# Patient Record
Sex: Male | Born: 1945
Health system: Southern US, Community
[De-identification: ages and names within clinical notes are randomized; demographics above are authoritative.]

## PROBLEM LIST (undated history)

## (undated) DIAGNOSIS — C801 Malignant (primary) neoplasm, unspecified: Secondary | ICD-10-CM

## (undated) DIAGNOSIS — N400 Enlarged prostate without lower urinary tract symptoms: Secondary | ICD-10-CM

## (undated) DIAGNOSIS — L989 Disorder of the skin and subcutaneous tissue, unspecified: Secondary | ICD-10-CM

## (undated) DIAGNOSIS — N200 Calculus of kidney: Secondary | ICD-10-CM

## (undated) DIAGNOSIS — E039 Hypothyroidism, unspecified: Secondary | ICD-10-CM

## (undated) DIAGNOSIS — Z85828 Personal history of other malignant neoplasm of skin: Secondary | ICD-10-CM

## (undated) DIAGNOSIS — Z9289 Personal history of other medical treatment: Secondary | ICD-10-CM

## (undated) DIAGNOSIS — I5189 Other ill-defined heart diseases: Secondary | ICD-10-CM

## (undated) DIAGNOSIS — N1330 Unspecified hydronephrosis: Secondary | ICD-10-CM

## (undated) HISTORY — PX: URETHRAL STRICTURE DILATATION: SHX477

## (undated) HISTORY — PX: CYSTOSCOPY PROSTATE W/ LASER: SUR372

---

## 2003-11-06 ENCOUNTER — Ambulatory Visit (HOSPITAL_COMMUNITY): Admission: RE | Admit: 2003-11-06 | Discharge: 2003-11-06 | Payer: Self-pay | Admitting: Urology

## 2004-01-27 ENCOUNTER — Ambulatory Visit (HOSPITAL_COMMUNITY): Admission: RE | Admit: 2004-01-27 | Discharge: 2004-01-27 | Payer: Self-pay | Admitting: Urology

## 2007-06-14 ENCOUNTER — Ambulatory Visit (HOSPITAL_COMMUNITY): Admission: RE | Admit: 2007-06-14 | Discharge: 2007-06-14 | Payer: Self-pay | Admitting: Urology

## 2009-03-17 ENCOUNTER — Inpatient Hospital Stay (HOSPITAL_COMMUNITY): Admission: EM | Admit: 2009-03-17 | Discharge: 2009-03-25 | Payer: Self-pay | Admitting: Emergency Medicine

## 2009-03-17 ENCOUNTER — Ambulatory Visit: Payer: Self-pay | Admitting: Cardiology

## 2009-03-22 ENCOUNTER — Encounter (INDEPENDENT_AMBULATORY_CARE_PROVIDER_SITE_OTHER): Payer: Self-pay | Admitting: Internal Medicine

## 2009-04-07 ENCOUNTER — Ambulatory Visit (HOSPITAL_COMMUNITY): Admission: RE | Admit: 2009-04-07 | Discharge: 2009-04-07 | Payer: Self-pay | Admitting: Urology

## 2009-05-11 ENCOUNTER — Observation Stay (HOSPITAL_COMMUNITY): Admission: RE | Admit: 2009-05-11 | Discharge: 2009-05-13 | Payer: Self-pay | Admitting: Urology

## 2009-06-01 ENCOUNTER — Ambulatory Visit (HOSPITAL_COMMUNITY): Admission: RE | Admit: 2009-06-01 | Discharge: 2009-06-01 | Payer: Self-pay | Admitting: Urology

## 2009-11-29 ENCOUNTER — Emergency Department (HOSPITAL_COMMUNITY): Admission: EM | Admit: 2009-11-29 | Discharge: 2009-11-29 | Payer: Self-pay | Admitting: Emergency Medicine

## 2009-12-01 ENCOUNTER — Ambulatory Visit (HOSPITAL_COMMUNITY): Admission: RE | Admit: 2009-12-01 | Discharge: 2009-12-01 | Payer: Self-pay

## 2010-03-20 HISTORY — PX: TRANSURETHRAL RESECTION OF PROSTATE: SHX73

## 2010-06-02 LAB — DIFFERENTIAL
Basophils Absolute: 0 10*3/uL (ref 0.0–0.1)
Basophils Relative: 0 % (ref 0–1)
Eosinophils Absolute: 0 10*3/uL (ref 0.0–0.7)
Eosinophils Relative: 0 % (ref 0–5)
Lymphocytes Relative: 4 % — ABNORMAL LOW (ref 12–46)
Lymphs Abs: 0.5 10*3/uL — ABNORMAL LOW (ref 0.7–4.0)
Monocytes Absolute: 0.7 10*3/uL (ref 0.1–1.0)
Monocytes Relative: 6 % (ref 3–12)
Neutro Abs: 11.4 10*3/uL — ABNORMAL HIGH (ref 1.7–7.7)
Neutrophils Relative %: 90 % — ABNORMAL HIGH (ref 43–77)

## 2010-06-02 LAB — BASIC METABOLIC PANEL
BUN: 25 mg/dL — ABNORMAL HIGH (ref 6–23)
CO2: 26 mEq/L (ref 19–32)
Calcium: 9.4 mg/dL (ref 8.4–10.5)
Chloride: 100 mEq/L (ref 96–112)
Creatinine, Ser: 1.86 mg/dL — ABNORMAL HIGH (ref 0.4–1.5)
GFR calc Af Amer: 44 mL/min — ABNORMAL LOW (ref >60)
GFR calc non Af Amer: 37 mL/min — ABNORMAL LOW (ref >60)
Glucose, Bld: 119 mg/dL — ABNORMAL HIGH (ref 70–99)
Potassium: 4 mEq/L (ref 3.5–5.1)
Sodium: 135 mEq/L (ref 135–145)

## 2010-06-02 LAB — URINALYSIS, ROUTINE W REFLEX MICROSCOPIC
Bilirubin Urine: NEGATIVE
Glucose, UA: NEGATIVE mg/dL
Ketones, ur: NEGATIVE mg/dL
Nitrite: POSITIVE — AB
Protein, ur: 30 mg/dL — AB
Specific Gravity, Urine: 1.02 (ref 1.005–1.030)
Urobilinogen, UA: 0.2 mg/dL (ref 0.0–1.0)
pH: 6 (ref 5.0–8.0)

## 2010-06-02 LAB — URINE MICROSCOPIC-ADD ON

## 2010-06-02 LAB — URINE CULTURE
Colony Count: >=100000
Culture  Setup Time: 201109122105

## 2010-06-02 LAB — CBC
HCT: 44.5 % (ref 39.0–52.0)
Hemoglobin: 15 g/dL (ref 13.0–17.0)
MCH: 31 pg (ref 26.0–34.0)
MCHC: 33.7 g/dL (ref 30.0–36.0)
MCV: 92.1 fL (ref 78.0–100.0)
Platelets: 193 10*3/uL (ref 150–400)
RBC: 4.83 MIL/uL (ref 4.22–5.81)
RDW: 14.2 % (ref 11.5–15.5)
WBC: 12.7 10*3/uL — ABNORMAL HIGH (ref 4.0–10.5)

## 2010-06-05 LAB — BASIC METABOLIC PANEL
BUN: 33 mg/dL — ABNORMAL HIGH (ref 6–23)
BUN: 44 mg/dL — ABNORMAL HIGH (ref 6–23)
CO2: 23 mEq/L (ref 19–32)
CO2: 26 mEq/L (ref 19–32)
Calcium: 7.3 mg/dL — ABNORMAL LOW (ref 8.4–10.5)
Calcium: 7.5 mg/dL — ABNORMAL LOW (ref 8.4–10.5)
Chloride: 104 mEq/L (ref 96–112)
Chloride: 106 mEq/L (ref 96–112)
Creatinine, Ser: 1.37 mg/dL (ref 0.4–1.5)
Creatinine, Ser: 1.64 mg/dL — ABNORMAL HIGH (ref 0.4–1.5)
GFR calc Af Amer: >60 mL/min (ref >60)
GFR calc non Af Amer: 52 mL/min — ABNORMAL LOW (ref >60)
GFR calc non Af Amer: 55 mL/min — ABNORMAL LOW (ref >60)
Glucose, Bld: 88 mg/dL (ref 70–99)
Glucose, Bld: 91 mg/dL (ref 70–99)
Potassium: 3.8 mEq/L (ref 3.5–5.1)
Sodium: 135 mEq/L (ref 135–145)
Sodium: 137 mEq/L (ref 135–145)

## 2010-06-05 LAB — POCT I-STAT 4, (NA,K, GLUC, HGB,HCT)
Glucose, Bld: 78 mg/dL (ref 70–99)
HCT: 27 % — ABNORMAL LOW (ref 39.0–52.0)

## 2010-06-05 LAB — UIFE/LIGHT CHAINS/TP QN, 24-HR UR
Alpha 1, Urine: DETECTED — AB
Alpha 2, Urine: DETECTED — AB
Free Kappa/Lambda Ratio: 2.18 ratio (ref 0.46–4.00)
Free Lt Chn Excr Rate: 1000.35 mg/d
Total Protein, Urine: 56.9 mg/dL
Volume, Urine: 5850 mL

## 2010-06-05 LAB — DIFFERENTIAL
Basophils Absolute: 0 10*3/uL (ref 0.0–0.1)
Basophils Absolute: 0 10*3/uL (ref 0.0–0.1)
Basophils Relative: 0 % (ref 0–1)
Basophils Relative: 0 % (ref 0–1)
Basophils Relative: 1 % (ref 0–1)
Eosinophils Absolute: 0.1 10*3/uL (ref 0.0–0.7)
Eosinophils Absolute: 0.1 10*3/uL (ref 0.0–0.7)
Eosinophils Absolute: 0.2 10*3/uL (ref 0.0–0.7)
Eosinophils Relative: 0 % (ref 0–5)
Eosinophils Relative: 2 % (ref 0–5)
Lymphocytes Relative: 10 % — ABNORMAL LOW (ref 12–46)
Lymphocytes Relative: 4 % — ABNORMAL LOW (ref 12–46)
Lymphocytes Relative: 7 % — ABNORMAL LOW (ref 12–46)
Lymphs Abs: 0.4 10*3/uL — ABNORMAL LOW (ref 0.7–4.0)
Lymphs Abs: 0.6 10*3/uL — ABNORMAL LOW (ref 0.7–4.0)
Lymphs Abs: 0.6 10*3/uL — ABNORMAL LOW (ref 0.7–4.0)
Monocytes Absolute: 0.7 10*3/uL (ref 0.1–1.0)
Monocytes Absolute: 0.7 10*3/uL (ref 0.1–1.0)
Monocytes Absolute: 0.9 10*3/uL (ref 0.1–1.0)
Monocytes Absolute: 1.3 10*3/uL — ABNORMAL HIGH (ref 0.1–1.0)
Monocytes Relative: 11 % (ref 3–12)
Monocytes Relative: 8 % (ref 3–12)
Neutro Abs: 7.1 10*3/uL (ref 1.7–7.7)
Neutro Abs: 8.9 10*3/uL — ABNORMAL HIGH (ref 1.7–7.7)
Neutro Abs: 9.9 10*3/uL — ABNORMAL HIGH (ref 1.7–7.7)
Neutrophils Relative %: 83 % — ABNORMAL HIGH (ref 43–77)
Neutrophils Relative %: 83 % — ABNORMAL HIGH (ref 43–77)
Neutrophils Relative %: 85 % — ABNORMAL HIGH (ref 43–77)

## 2010-06-05 LAB — RENAL FUNCTION PANEL
Albumin: 2.1 g/dL — ABNORMAL LOW (ref 3.5–5.2)
Albumin: 2.2 g/dL — ABNORMAL LOW (ref 3.5–5.2)
BUN: 121 mg/dL — ABNORMAL HIGH (ref 6–23)
BUN: 84 mg/dL — ABNORMAL HIGH (ref 6–23)
CO2: 24 mEq/L (ref 19–32)
CO2: 26 mEq/L (ref 19–32)
Calcium: 7.4 mg/dL — ABNORMAL LOW (ref 8.4–10.5)
Calcium: 8.4 mg/dL (ref 8.4–10.5)
Chloride: 109 mEq/L (ref 96–112)
Chloride: 98 mEq/L (ref 96–112)
Creatinine, Ser: 2.3 mg/dL — ABNORMAL HIGH (ref 0.4–1.5)
Creatinine, Ser: 7.15 mg/dL — ABNORMAL HIGH (ref 0.4–1.5)
GFR calc Af Amer: 35 mL/min — ABNORMAL LOW (ref >60)
GFR calc Af Amer: 9 mL/min — ABNORMAL LOW (ref >60)
GFR calc Af Amer: >60 mL/min (ref >60)
GFR calc non Af Amer: 29 mL/min — ABNORMAL LOW (ref >60)
GFR calc non Af Amer: 54 mL/min — ABNORMAL LOW (ref >60)
GFR calc non Af Amer: 8 mL/min — ABNORMAL LOW (ref >60)
Glucose, Bld: 100 mg/dL — ABNORMAL HIGH (ref 70–99)
Glucose, Bld: 88 mg/dL (ref 70–99)
Glucose, Bld: 96 mg/dL (ref 70–99)
Phosphorus: 3.1 mg/dL (ref 2.3–4.6)
Phosphorus: 3.5 mg/dL (ref 2.3–4.6)
Phosphorus: 5.4 mg/dL — ABNORMAL HIGH (ref 2.3–4.6)
Potassium: 3.7 mEq/L (ref 3.5–5.1)
Potassium: 3.8 mEq/L (ref 3.5–5.1)
Potassium: 4 mEq/L (ref 3.5–5.1)
Potassium: 4.1 mEq/L (ref 3.5–5.1)
Sodium: 137 mEq/L (ref 135–145)
Sodium: 140 mEq/L (ref 135–145)
Sodium: 142 mEq/L (ref 135–145)

## 2010-06-05 LAB — CBC
HCT: 25.1 % — ABNORMAL LOW (ref 39.0–52.0)
HCT: 27.5 % — ABNORMAL LOW (ref 39.0–52.0)
Hemoglobin: 8.4 g/dL — ABNORMAL LOW (ref 13.0–17.0)
Hemoglobin: 9.2 g/dL — ABNORMAL LOW (ref 13.0–17.0)
Hemoglobin: 9.4 g/dL — ABNORMAL LOW (ref 13.0–17.0)
MCHC: 33.6 g/dL (ref 30.0–36.0)
MCHC: 33.7 g/dL (ref 30.0–36.0)
MCHC: 34.2 g/dL (ref 30.0–36.0)
MCV: 87.2 fL (ref 78.0–100.0)
MCV: 88.4 fL (ref 78.0–100.0)
MCV: 89.1 fL (ref 78.0–100.0)
Platelets: 377 10*3/uL (ref 150–400)
RBC: 3.07 MIL/uL — ABNORMAL LOW (ref 4.22–5.81)
RBC: 3.16 MIL/uL — ABNORMAL LOW (ref 4.22–5.81)
RDW: 15.1 % (ref 11.5–15.5)
RDW: 15.1 % (ref 11.5–15.5)
RDW: 15.6 % — ABNORMAL HIGH (ref 11.5–15.5)
WBC: 11.7 10*3/uL — ABNORMAL HIGH (ref 4.0–10.5)
WBC: 8.6 10*3/uL (ref 4.0–10.5)

## 2010-06-05 LAB — CROSSMATCH: Antibody Screen: NEGATIVE

## 2010-06-05 LAB — LIPASE, BLOOD: Lipase: 180 U/L — ABNORMAL HIGH (ref 11–59)

## 2010-06-05 LAB — MAGNESIUM: Magnesium: 1.8 mg/dL (ref 1.5–2.5)

## 2010-06-05 LAB — ABO/RH: ABO/RH(D): O POS

## 2010-06-05 LAB — GLUCOSE, CAPILLARY
Glucose-Capillary: 102 mg/dL — ABNORMAL HIGH (ref 70–99)
Glucose-Capillary: 114 mg/dL — ABNORMAL HIGH (ref 70–99)

## 2010-06-05 LAB — PROTEIN ELECTROPHORESIS, SERUM
Alpha-1-Globulin: 13.5 % — ABNORMAL HIGH (ref 2.9–4.9)
Alpha-2-Globulin: 16.7 % — ABNORMAL HIGH (ref 7.1–11.8)
Beta Globulin: 6.3 % (ref 4.7–7.2)
M-Spike, %: NOT DETECTED g/dL
Total Protein ELP: 6.6 g/dL (ref 6.0–8.3)

## 2010-06-05 LAB — LIPID PANEL
Cholesterol: 94 mg/dL (ref 0–200)
HDL: 15 mg/dL — ABNORMAL LOW (ref >39)
LDL Cholesterol: 66 mg/dL (ref 0–99)
Triglycerides: 67 mg/dL (ref <150)

## 2010-06-05 LAB — BRAIN NATRIURETIC PEPTIDE: Pro B Natriuretic peptide (BNP): 121 pg/mL — ABNORMAL HIGH (ref 0.0–100.0)

## 2010-06-09 LAB — BASIC METABOLIC PANEL
BUN: 12 mg/dL (ref 6–23)
BUN: 15 mg/dL (ref 6–23)
CO2: 27 mEq/L (ref 19–32)
CO2: 27 mEq/L (ref 19–32)
Calcium: 9.3 mg/dL (ref 8.4–10.5)
Chloride: 102 mEq/L (ref 96–112)
Chloride: 104 mEq/L (ref 96–112)
Creatinine, Ser: 1.05 mg/dL (ref 0.4–1.5)
Glucose, Bld: 105 mg/dL — ABNORMAL HIGH (ref 70–99)
Potassium: 4.2 mEq/L (ref 3.5–5.1)

## 2010-06-09 LAB — CBC
HCT: 32.3 % — ABNORMAL LOW (ref 39.0–52.0)
MCHC: 34.3 g/dL (ref 30.0–36.0)
MCV: 93.4 fL (ref 78.0–100.0)
Platelets: 257 10*3/uL (ref 150–400)
RDW: 16.4 % — ABNORMAL HIGH (ref 11.5–15.5)

## 2010-06-09 LAB — DIFFERENTIAL
Basophils Relative: 0 % (ref 0–1)
Eosinophils Absolute: 0 10*3/uL (ref 0.0–0.7)
Eosinophils Relative: 1 % (ref 0–5)
Lymphs Abs: 0.8 10*3/uL (ref 0.7–4.0)
Monocytes Absolute: 0.7 10*3/uL (ref 0.1–1.0)
Monocytes Relative: 9 % (ref 3–12)

## 2010-06-20 LAB — LIPASE, BLOOD: Lipase: 110 U/L — ABNORMAL HIGH (ref 11–59)

## 2010-06-20 LAB — GLUCOSE, CAPILLARY
Glucose-Capillary: 110 mg/dL — ABNORMAL HIGH (ref 70–99)
Glucose-Capillary: 125 mg/dL — ABNORMAL HIGH (ref 70–99)
Glucose-Capillary: 144 mg/dL — ABNORMAL HIGH (ref 70–99)
Glucose-Capillary: 146 mg/dL — ABNORMAL HIGH (ref 70–99)
Glucose-Capillary: 146 mg/dL — ABNORMAL HIGH (ref 70–99)
Glucose-Capillary: 151 mg/dL — ABNORMAL HIGH (ref 70–99)

## 2010-06-20 LAB — DIFFERENTIAL
Basophils Absolute: 0 10*3/uL (ref 0.0–0.1)
Basophils Absolute: 0 10*3/uL (ref 0.0–0.1)
Basophils Relative: 0 % (ref 0–1)
Eosinophils Relative: 0 % (ref 0–5)
Eosinophils Relative: 0 % (ref 0–5)
Lymphocytes Relative: 2 % — ABNORMAL LOW (ref 12–46)
Lymphs Abs: 0.4 10*3/uL — ABNORMAL LOW (ref 0.7–4.0)
Monocytes Absolute: 1.3 10*3/uL — ABNORMAL HIGH (ref 0.1–1.0)
Monocytes Absolute: 1.5 10*3/uL — ABNORMAL HIGH (ref 0.1–1.0)
Monocytes Relative: 6 % (ref 3–12)
Neutro Abs: 13.6 10*3/uL — ABNORMAL HIGH (ref 1.7–7.7)
Neutro Abs: 17.5 10*3/uL — ABNORMAL HIGH (ref 1.7–7.7)
Neutrophils Relative %: 92 % — ABNORMAL HIGH (ref 43–77)
Neutrophils Relative %: 93 % — ABNORMAL HIGH (ref 43–77)

## 2010-06-20 LAB — URINE MICROSCOPIC-ADD ON

## 2010-06-20 LAB — BASIC METABOLIC PANEL
CO2: 10 mEq/L — ABNORMAL LOW (ref 19–32)
CO2: 15 mEq/L — ABNORMAL LOW (ref 19–32)
Calcium: 8.5 mg/dL (ref 8.4–10.5)
Calcium: 9.1 mg/dL (ref 8.4–10.5)
Calcium: 9.6 mg/dL (ref 8.4–10.5)
Creatinine, Ser: 13.43 mg/dL — ABNORMAL HIGH (ref 0.4–1.5)
GFR calc Af Amer: 4 mL/min — ABNORMAL LOW (ref >60)
GFR calc Af Amer: 7 mL/min — ABNORMAL LOW (ref >60)
GFR calc non Af Amer: 3 mL/min — ABNORMAL LOW (ref >60)
GFR calc non Af Amer: 6 mL/min — ABNORMAL LOW (ref >60)
Glucose, Bld: 187 mg/dL — ABNORMAL HIGH (ref 70–99)
Potassium: 3.2 mEq/L — ABNORMAL LOW (ref 3.5–5.1)
Potassium: 5.6 mEq/L — ABNORMAL HIGH (ref 3.5–5.1)
Sodium: 135 mEq/L (ref 135–145)
Sodium: 139 mEq/L (ref 135–145)

## 2010-06-20 LAB — CBC
HCT: 26.9 % — ABNORMAL LOW (ref 39.0–52.0)
HCT: 30.6 % — ABNORMAL LOW (ref 39.0–52.0)
Hemoglobin: 10.5 g/dL — ABNORMAL LOW (ref 13.0–17.0)
Hemoglobin: 9.3 g/dL — ABNORMAL LOW (ref 13.0–17.0)
Hemoglobin: 9.4 g/dL — ABNORMAL LOW (ref 13.0–17.0)
MCHC: 33.8 g/dL (ref 30.0–36.0)
MCHC: 34.4 g/dL (ref 30.0–36.0)
RBC: 3.11 MIL/uL — ABNORMAL LOW (ref 4.22–5.81)
RBC: 3.5 MIL/uL — ABNORMAL LOW (ref 4.22–5.81)
RDW: 15.3 % (ref 11.5–15.5)
WBC: 15.4 10*3/uL — ABNORMAL HIGH (ref 4.0–10.5)

## 2010-06-20 LAB — HEPATIC FUNCTION PANEL
ALT: 11 U/L (ref 0–53)
Albumin: 2.9 g/dL — ABNORMAL LOW (ref 3.5–5.2)
Alkaline Phosphatase: 76 U/L (ref 39–117)
Total Protein: 7.4 g/dL (ref 6.0–8.3)

## 2010-06-20 LAB — LIPID PANEL
Cholesterol: 109 mg/dL (ref 0–200)
HDL: 48 mg/dL (ref >39)
LDL Cholesterol: 46 mg/dL (ref 0–99)
Total CHOL/HDL Ratio: 2.3 RATIO
Triglycerides: 73 mg/dL (ref <150)

## 2010-06-20 LAB — URINALYSIS, ROUTINE W REFLEX MICROSCOPIC
Glucose, UA: NEGATIVE mg/dL
Protein, ur: 30 mg/dL — AB
Specific Gravity, Urine: 1.01 (ref 1.005–1.030)
pH: 7.5 (ref 5.0–8.0)

## 2010-06-20 LAB — RETICULOCYTES
RBC.: 3.14 MIL/uL — ABNORMAL LOW (ref 4.22–5.81)
Retic Count, Absolute: 9.4 10*3/uL — ABNORMAL LOW (ref 19.0–186.0)

## 2010-06-20 LAB — IRON AND TIBC

## 2010-06-20 LAB — URINE CULTURE: Colony Count: 15000

## 2010-06-26 ENCOUNTER — Emergency Department (HOSPITAL_COMMUNITY)
Admission: EM | Admit: 2010-06-26 | Discharge: 2010-06-26 | Disposition: A | Discharge status: Home / Self Care | Payer: Medicare Other | Attending: Emergency Medicine | Admitting: Emergency Medicine

## 2010-06-26 DIAGNOSIS — R339 Retention of urine, unspecified: Secondary | ICD-10-CM | POA: Insufficient documentation

## 2010-06-26 DIAGNOSIS — N390 Urinary tract infection, site not specified: Secondary | ICD-10-CM | POA: Insufficient documentation

## 2010-06-26 DIAGNOSIS — T8389XA Other specified complication of genitourinary prosthetic devices, implants and grafts, initial encounter: Secondary | ICD-10-CM | POA: Insufficient documentation

## 2010-06-26 DIAGNOSIS — Y849 Medical procedure, unspecified as the cause of abnormal reaction of the patient, or of later complication, without mention of misadventure at the time of the procedure: Secondary | ICD-10-CM | POA: Insufficient documentation

## 2010-06-26 DIAGNOSIS — E039 Hypothyroidism, unspecified: Secondary | ICD-10-CM | POA: Insufficient documentation

## 2010-06-26 LAB — URINALYSIS, ROUTINE W REFLEX MICROSCOPIC
Glucose, UA: NEGATIVE mg/dL
Ketones, ur: NEGATIVE mg/dL
Protein, ur: 100 mg/dL — AB

## 2010-06-29 LAB — URINE CULTURE
Colony Count: >=100000
Culture  Setup Time: 201204100019

## 2010-07-12 ENCOUNTER — Other Ambulatory Visit: Payer: Self-pay | Admitting: Urology

## 2010-07-12 ENCOUNTER — Encounter (HOSPITAL_COMMUNITY): Payer: Medicare Other

## 2010-07-12 LAB — BASIC METABOLIC PANEL
BUN: 18 mg/dL (ref 6–23)
Calcium: 9.3 mg/dL (ref 8.4–10.5)
Creatinine, Ser: 1.31 mg/dL (ref 0.4–1.5)
GFR calc Af Amer: >60 mL/min (ref >60)

## 2010-07-12 LAB — CBC
MCH: 30.8 pg (ref 26.0–34.0)
MCHC: 33.8 g/dL (ref 30.0–36.0)
Platelets: 280 10*3/uL (ref 150–400)
RDW: 14.4 % (ref 11.5–15.5)

## 2010-07-12 LAB — DIFFERENTIAL
Basophils Relative: 0 % (ref 0–1)
Eosinophils Absolute: 0.1 10*3/uL (ref 0.0–0.7)
Eosinophils Relative: 1 % (ref 0–5)
Monocytes Absolute: 0.6 10*3/uL (ref 0.1–1.0)
Monocytes Relative: 9 % (ref 3–12)
Neutrophils Relative %: 69 % (ref 43–77)

## 2010-07-12 LAB — SURGICAL PCR SCREEN
MRSA, PCR: NEGATIVE
Staphylococcus aureus: NEGATIVE

## 2010-07-14 ENCOUNTER — Other Ambulatory Visit: Payer: Self-pay | Admitting: Urology

## 2010-07-14 ENCOUNTER — Inpatient Hospital Stay (HOSPITAL_COMMUNITY)
Admission: RE | Admit: 2010-07-14 | Discharge: 2010-07-15 | DRG: 714 | Disposition: A | Discharge status: Home / Self Care | Payer: Medicare Other | Source: Ambulatory Visit | Attending: Urology | Admitting: Urology

## 2010-07-14 DIAGNOSIS — N210 Calculus in bladder: Secondary | ICD-10-CM | POA: Diagnosis present

## 2010-07-14 DIAGNOSIS — N320 Bladder-neck obstruction: Secondary | ICD-10-CM | POA: Diagnosis present

## 2010-07-14 DIAGNOSIS — N138 Other obstructive and reflux uropathy: Principal | ICD-10-CM | POA: Diagnosis present

## 2010-07-14 DIAGNOSIS — N401 Enlarged prostate with lower urinary tract symptoms: Principal | ICD-10-CM | POA: Diagnosis present

## 2010-07-14 DIAGNOSIS — R339 Retention of urine, unspecified: Secondary | ICD-10-CM | POA: Diagnosis present

## 2010-07-14 DIAGNOSIS — Z01812 Encounter for preprocedural laboratory examination: Secondary | ICD-10-CM

## 2010-07-15 LAB — BASIC METABOLIC PANEL
BUN: 12 mg/dL (ref 6–23)
Calcium: 9.1 mg/dL (ref 8.4–10.5)
GFR calc non Af Amer: >60 mL/min (ref >60)
Glucose, Bld: 94 mg/dL (ref 70–99)
Potassium: 4.6 mEq/L (ref 3.5–5.1)

## 2010-07-15 LAB — CBC
MCHC: 33.2 g/dL (ref 30.0–36.0)
MCV: 91.7 fL (ref 78.0–100.0)
Platelets: 224 10*3/uL (ref 150–400)
RDW: 14.5 % (ref 11.5–15.5)
WBC: 8.3 10*3/uL (ref 4.0–10.5)

## 2010-07-15 LAB — DIFFERENTIAL
Basophils Absolute: 0 10*3/uL (ref 0.0–0.1)
Eosinophils Absolute: 0.1 10*3/uL (ref 0.0–0.7)
Eosinophils Relative: 1 % (ref 0–5)
Lymphs Abs: 1.1 10*3/uL (ref 0.7–4.0)
Monocytes Absolute: 0.7 10*3/uL (ref 0.1–1.0)

## 2010-07-19 LAB — STONE ANALYSIS

## 2010-07-19 NOTE — Op Note (Signed)
  NAME:  ALISTAIR, SENFT           ACCOUNT NO.:  [id number]  MEDICAL RECORD NO.:  [id number]           PATIENT TYPE:  I  LOCATION:  A211                          FACILITY:  [facility]  PHYSICIAN:  Ky Barban, M.D.DATE OF BIRTH:  05/26/1945  DATE OF PROCEDURE:  07/14/2010 DATE OF DISCHARGE:                              OPERATIVE REPORT   PREOPERATIVE DIAGNOSES: 1. Acute urinary retention. 2. Benign prostatic hypertrophy.  POSTOPERATIVE DIAGNOSES: 1. Benign prostatic hypertrophy. 2. Bladder calculi.  ANESTHESIA:  Spinal.  PROCEDURES: 1. TUR prostate. 2. Litholapaxy.  Anesthesia spinal given after usual prep and drape in lithotomy position.  I introduced #28 Iglesias resectoscope into the bladder, then I found several bladder calculi, so I decided to get the stones out first, so I removed the resectoscope and introduced the mechanical lithotrite.  The stones were crushed with the lithotrite and pieces were removed with the help of an Ellik evacuator.  Now, the resectoscope was reintroduced and a proceeded to TUR prostate.  Bladder neck circumferentially resected down to the circular fibers and the bleeders were coagulated.  Resectoscope was pulled back at the level of the verumontanum, rotated to 11 o'clock position.  Resection of the right lobe was done between 11 and 7 o'clock position.  Similarly, the left lobe was resected between 1 and 5 o'clock position.  There is a small amount of anterior and posterior midline tissue resected next.  There was considerable amount of tissue near the apex on both sides and it is resected very carefully, not to injure the sphincter of the verumontanum.  The prostatic urethra now looks open.  Chips were evacuated.  Bleeders were coagulated.  At this point, the resectoscope was removed and 22 3-way Foley catheter left in for drainage.  CBI started which is clear.  The patient left the operating room in satisfactory  condition.    Ky Barban, M.D.    MIJ/MEDQ  D:  07/14/2010  T:  07/15/2010  Job:  [id number]  Electronically Signed by Alleen Borne M.D. on 07/19/2010 02:11:00 PM

## 2010-07-19 NOTE — H&P (Addendum)
  NAME:  Anthony Glass, Anthony Glass           ACCOUNT NO.:  [id number]  MEDICAL RECORD NO.:  [id number]           PATIENT TYPE:  LOCATION:                                FACILITY:  [facility]  PHYSICIAN:  Ky Barban, M.D.DATE OF BIRTH:  1946/01/16  DATE OF ADMISSION:  07/14/2010 DATE OF DISCHARGE:  LH                             HISTORY & PHYSICAL   A 65 year old gentleman who is being followed by me for long time. Several years ago, he was diagnosed with a urethral stricture.  I have done optical urethrotomy, but I told him that he needs to come back for followup.  He never showed up till last year in February when he was found to be in renal failure and had renal ultrasound was done that shows bilateral hydroureter nephrosis, distended bladder.  He was in urinary retention and we put a suprapubic catheter to drain his bladder. After several days, the BUN and creatinine slowly came down anyway. Subsequently, he underwent urethroplasty and he is doing well except he went into retention.  Again this time I find out, he also has BPH, needs to have a TUR prostate.  He is waiting to have it done for the last 1 year and he periodically comes.  We changed his suprapubic catheter.  He is clinically doing well, so finally he decided because he has Medicare now.  He wants to go ahead and have TUR prostate done for which he is coming as an outpatient, will undergo TUR prostate and I have discussed the procedure limitation, complication, he understands.  PAST MEDICAL HISTORY:  Please refer to his old record.  I have been following him since 2005.  His initial visit was on November 05, 2003.  At that time, he had gross hematuria but the workup was negative.  SOCIAL HISTORY:  He lives by himself, not married, does not smoke or drink.  REVIEW OF SYSTEMS:  Unremarkable.  PHYSICAL EXAMINATION:  VITAL SIGNS:  Blood pressure is 120/80, temperature is normal. CENTRAL NERVOUS SYSTEM:  Negative. HEAD,  NECK, AND ENT: Negative. CHEST:  Symmetrical. HEART:  Regular sinus rhythm. ABDOMEN:  Soft, flat.  Liver, spleen, kidneys are not palpable.  No CVA tenderness. GU:  External genitalia, he has suprapubic catheter in place, draining clear urine.  Testicles are normal. RECTAL:  Normal sphincter tone.  No rectal mass.  Prostate 1/2+ smooth and firm.  IMPRESSION:  Benign prostatic hypertrophy with bladder neck obstruction and history of urethroplasty.  It should be mentioned that his suprapubic catheter has come out, now he has Foley catheter which we change periodically.  So, we are going to do TUR prostate tomorrow, then admit him in the hospital overnight.     Ky Barban, M.D.     MIJ/MEDQ  D:  07/13/2010  T:  07/14/2010  Job:  [id number]  Electronically Signed by Alleen Borne M.D. on 08/18/2010 03:09:10 PM

## 2010-08-02 NOTE — Consult Note (Signed)
NAME:  JAYME, CHAM NO.:  [id number]   MEDICAL RECORD NO.:  [id number]          PATIENT TYPE:  OUT   LOCATION:  RAD                           FACILITY:  [facility]   PHYSICIAN:  Ky Barban, M.D.DATE OF BIRTH:  1945-10-15   DATE OF CONSULTATION:  DATE OF DISCHARGE:                                 CONSULTATION   This 65 year old gentleman has a urethral stricture, possible, and I  want him to have a retrograde urethrogram.  He is coming as an  outpatient to have it done in the x-ray department.  This history and  physical is for their use.  This gentleman has had difficulty voiding.  I have known him for some time.  In 2005, he had a workup done, and he  was found to have a stricture in his bulbar urethra.  At that time, he  underwent internal urethrotomy.  Postoperatively he did fine.  He  developed the problem again.  I told him he was better off if we go  ahead and do urethroplasty, but he never came back until now, and so I  told him that we need to do a retrograde urethrogram and probably will  need to do urethroplasty.  He understands, so I am going to send him to  have a retrograde urethrogram.   PAST MEDICAL HISTORY:  He has a history of having mental retardation.  No other history of any significant medical problems.   PHYSICAL EXAMINATION:  GENERAL:  Moderately built, not in acute  distress, fully conscious, alert, oriented.  VITAL SIGNS:  Blood pressure 110/80.  Temperature is normal.  CENTRAL  NERVOUS SYSTEM:  Negative.  HEAD/NECK:  Eye, ENT negative.  CHEST:  Symmetrical.  HEART:  Regular sinus rhythm, no murmur.  ABDOMEN:  Flat.  Liver, spleen, kidneys are not palpable.  EXTERNAL GENITALIA:  Unremarkable.  RECTAL:  Deferred.  EXTREMITIES:  Normal.   IMPRESSION:  Urethral stricture.   PLAN:  Retrograde urethrogram.      Ky Barban, M.D.  Electronically Signed     MIJ/MEDQ  D:  06/13/2007  T:  06/13/2007  Job:  [id number]

## 2010-09-20 NOTE — Discharge Summary (Signed)
  NAME:  Anthony Glass, Anthony Glass NO.:  [id number]  MEDICAL RECORD NO.:  [id number]  LOCATION:                                 FACILITY:  PHYSICIAN:  Ky Barban, M.D.DATE OF BIRTH:  02-16-1946  DATE OF ADMISSION: DATE OF DISCHARGE:  LH                              DISCHARGE SUMMARY   A 65 year old gentleman who is well-known to me for long time.  Several years ago he was diagnosed with urethral stricture.  I have done optical urethrotomy but he never came for followup.  Last year he came to the hospital, renal failure.  At that time he had severe bilateral hydroureteronephrosis, so we had to treat him with suprapubic catheter and over the next several days his BUN and creatinine came down to normal.  He was sent home with a suprapubic catheter because he has a stricture and his optical urethroplasty.  He was sent home and ultrasound shows that his upper tracts have decompressed.  I brought him back and did a urethroplasty.  Postoperatively, he was voiding fine and all of a sudden he went into retention and we find out that he has BPH with bladder neck obstruction.  The stricture area where he had urethroplasty looked fine, so I left the suprapubic catheter and he did not want to have his TUR prostate because he was waiting to have Medicaid, so he kept awaiting.  He will come periodically, we will change his suprapubic catheter and finally he decided to go ahead and do a TUR prostate for which he was brought in the hospital.  After having routine preoperative workup, CBC, urinalysis, SMA-7, EKG, chest x-ray are normal.  He was taken to the operating room and on 26 of April he was taken to the operating room.  He was found to have in addition to BPH bladder calculi, so underwent TUR prostate with litholapaxy.  Next day his urine is clear, so I decided to send him home with suprapubic catheter which I will take it out in the office.  His pathology report shows he  has BPH.  So he was discharged home on April 23.  I will see him back in the office on coming Monday.  His suprapubic catheter has been removed and he has a Foley catheter.  I am going to send him home with Foley catheter which I will take it out on this coming Monday.  FINAL DISCHARGE DIAGNOSES:  Benign prostatic hypertrophy, bladder calculi.  DISCHARGE CONDITION:  Improved.  DISCHARGE MEDICATIONS:  None.     Ky Barban, M.D.     MIJ/MEDQ  D:  09/14/2010  T:  09/15/2010  Job:  [id number]  Electronically Signed by Alleen Borne M.D. on 09/20/2010 02:47:23 PM

## 2011-12-10 ENCOUNTER — Inpatient Hospital Stay (HOSPITAL_COMMUNITY): Payer: Medicare Other

## 2011-12-10 ENCOUNTER — Inpatient Hospital Stay (HOSPITAL_COMMUNITY)
Admission: EM | Admit: 2011-12-10 | Discharge: 2011-12-13 | DRG: 669 | Disposition: A | Discharge status: Home / Self Care | Payer: Medicare Other | Attending: Internal Medicine | Admitting: Internal Medicine

## 2011-12-10 ENCOUNTER — Encounter (HOSPITAL_COMMUNITY): Payer: Self-pay

## 2011-12-10 ENCOUNTER — Other Ambulatory Visit: Payer: Self-pay

## 2011-12-10 ENCOUNTER — Emergency Department (HOSPITAL_COMMUNITY): Payer: Medicare Other

## 2011-12-10 DIAGNOSIS — I5189 Other ill-defined heart diseases: Secondary | ICD-10-CM

## 2011-12-10 DIAGNOSIS — R195 Other fecal abnormalities: Secondary | ICD-10-CM | POA: Diagnosis present

## 2011-12-10 DIAGNOSIS — E871 Hypo-osmolality and hyponatremia: Secondary | ICD-10-CM

## 2011-12-10 DIAGNOSIS — E861 Hypovolemia: Secondary | ICD-10-CM | POA: Diagnosis present

## 2011-12-10 DIAGNOSIS — Z87442 Personal history of urinary calculi: Secondary | ICD-10-CM

## 2011-12-10 DIAGNOSIS — D500 Iron deficiency anemia secondary to blood loss (chronic): Secondary | ICD-10-CM

## 2011-12-10 DIAGNOSIS — Z7982 Long term (current) use of aspirin: Secondary | ICD-10-CM

## 2011-12-10 DIAGNOSIS — N390 Urinary tract infection, site not specified: Secondary | ICD-10-CM

## 2011-12-10 DIAGNOSIS — N201 Calculus of ureter: Principal | ICD-10-CM

## 2011-12-10 DIAGNOSIS — K921 Melena: Secondary | ICD-10-CM

## 2011-12-10 DIAGNOSIS — N1330 Unspecified hydronephrosis: Secondary | ICD-10-CM | POA: Diagnosis present

## 2011-12-10 HISTORY — DX: Other ill-defined heart diseases: I51.89

## 2011-12-10 HISTORY — DX: Calculus of kidney: N20.0

## 2011-12-10 HISTORY — DX: Unspecified hydronephrosis: N13.30

## 2011-12-10 HISTORY — DX: Benign prostatic hyperplasia without lower urinary tract symptoms: N40.0

## 2011-12-10 LAB — URINE MICROSCOPIC-ADD ON

## 2011-12-10 LAB — BASIC METABOLIC PANEL
BUN: 24 mg/dL — ABNORMAL HIGH (ref 6–23)
Calcium: 10 mg/dL (ref 8.4–10.5)
Chloride: 98 mEq/L (ref 96–112)
Creatinine, Ser: 1.25 mg/dL (ref 0.50–1.35)
GFR calc Af Amer: 68 mL/min — ABNORMAL LOW (ref >90)

## 2011-12-10 LAB — HEPATIC FUNCTION PANEL
AST: 23 U/L (ref 0–37)
Albumin: 4 g/dL (ref 3.5–5.2)
Bilirubin, Direct: 0.1 mg/dL (ref 0.0–0.3)
Total Bilirubin: 0.3 mg/dL (ref 0.3–1.2)

## 2011-12-10 LAB — CBC WITH DIFFERENTIAL/PLATELET
Basophils Absolute: 0 10*3/uL (ref 0.0–0.1)
Basophils Relative: 0 % (ref 0–1)
Eosinophils Absolute: 0 10*3/uL (ref 0.0–0.7)
Eosinophils Relative: 0 % (ref 0–5)
HCT: 42.5 % (ref 39.0–52.0)
MCH: 31.6 pg (ref 26.0–34.0)
MCHC: 35.1 g/dL (ref 30.0–36.0)
MCV: 90.2 fL (ref 78.0–100.0)
Monocytes Absolute: 0.4 10*3/uL (ref 0.1–1.0)
Platelets: 241 10*3/uL (ref 150–400)
RDW: 13.5 % (ref 11.5–15.5)
WBC: 11.6 10*3/uL — ABNORMAL HIGH (ref 4.0–10.5)

## 2011-12-10 LAB — URINALYSIS, ROUTINE W REFLEX MICROSCOPIC
Bilirubin Urine: NEGATIVE
Glucose, UA: NEGATIVE mg/dL
Ketones, ur: NEGATIVE mg/dL
Protein, ur: NEGATIVE mg/dL
Urobilinogen, UA: 0.2 mg/dL (ref 0.0–1.0)

## 2011-12-10 LAB — PROTIME-INR: Prothrombin Time: 12.4 seconds (ref 11.6–15.2)

## 2011-12-10 LAB — TYPE AND SCREEN: Antibody Screen: NEGATIVE

## 2011-12-10 MED ORDER — PANTOPRAZOLE SODIUM 40 MG IV SOLR
40.0000 mg | Freq: Once | INTRAVENOUS | Status: AC
  Administered 2011-12-10: 40 mg via INTRAVENOUS
  Filled 2011-12-10: qty 40

## 2011-12-10 MED ORDER — SODIUM CHLORIDE 0.9 % IV SOLN
Freq: Once | INTRAVENOUS | Status: AC
  Administered 2011-12-10: 13:00:00 via INTRAVENOUS

## 2011-12-10 MED ORDER — HYDROMORPHONE HCL PF 1 MG/ML IJ SOLN: 1.0000 mg | INTRAMUSCULAR | Status: DC | PRN

## 2011-12-10 MED ORDER — ONDANSETRON HCL 4 MG/2ML IJ SOLN
4.0000 mg | Freq: Four times a day (QID) | INTRAMUSCULAR | Status: DC | PRN
  Administered 2011-12-12: 4 mg via INTRAVENOUS

## 2011-12-10 MED ORDER — SODIUM CHLORIDE 0.9 % IV SOLN: INTRAVENOUS | Status: DC

## 2011-12-10 MED ORDER — CEFTRIAXONE SODIUM 1 G IJ SOLR
1.0000 g | Freq: Once | INTRAMUSCULAR | Status: AC
  Administered 2011-12-10: 1 g via INTRAVENOUS
  Filled 2011-12-10: qty 10

## 2011-12-10 MED ORDER — PANTOPRAZOLE SODIUM 40 MG IV SOLR
40.0000 mg | Freq: Two times a day (BID) | INTRAVENOUS | Status: DC
  Administered 2011-12-10 – 2011-12-11 (×2): 40 mg via INTRAVENOUS
  Filled 2011-12-10 (×2): qty 40

## 2011-12-10 MED ORDER — MORPHINE SULFATE 2 MG/ML IJ SOLN: 2.0000 mg | INTRAMUSCULAR | Status: DC | PRN

## 2011-12-10 MED ORDER — ONDANSETRON HCL 4 MG PO TABS: 4.0000 mg | ORAL_TABLET | Freq: Four times a day (QID) | ORAL | Status: DC | PRN

## 2011-12-10 MED ORDER — SODIUM CHLORIDE 0.9 % IV BOLUS (SEPSIS)
1000.0000 mL | Freq: Once | INTRAVENOUS | Status: AC
  Administered 2011-12-10: 1000 mL via INTRAVENOUS

## 2011-12-10 MED ORDER — DEXTROSE-NACL 5-0.9 % IV SOLN
INTRAVENOUS | Status: DC
  Administered 2011-12-10 – 2011-12-11 (×3): via INTRAVENOUS

## 2011-12-10 MED ORDER — ONDANSETRON HCL 4 MG/2ML IJ SOLN: 4.0000 mg | Freq: Three times a day (TID) | INTRAMUSCULAR | Status: DC | PRN

## 2011-12-10 NOTE — ED Provider Notes (Signed)
History   This chart was scribed for Glynn Octave, MD by Charolett Bumpers . The patient was seen in room APA15/APA15. Patient's care was started at 1133.    CSN: [id number]  Arrival date & time 12/10/11  1108   First MD Initiated Contact with Patient 12/10/11 1133      Chief Complaint  Patient presents with  . Flank Pain    (Consider location/radiation/quality/duration/timing/severity/associated sxs/prior treatment) HPI Anthony Glass is a 66 y.o. male who presents to the Emergency Department complaining of waxing and waning, left flank pain that started yesterday. Pt reports associated lower back pain, vomiting, chills and testicle aches. Pt denies any radiation of pain, hemauria, dysuria, fevers. Pt denies any blood in stools. He reports a h/o kidney stones and prostate cancer. Pt states that Dr. Jerre Simon preformed a TURP. Pt denies any h/o MI but reports that he has had chest pains in the past. Pt denies any excessive aspirin usage. He denies any alcohol use.    Past Medical History  Diagnosis Date  . Renal disorder   . Hyperchloremia     Past Surgical History  Procedure Date  . Cystoscopy prostate w/ laser     No family history on file.  History  Substance Use Topics  . Smoking status: Never Smoker   . Smokeless tobacco: Not on file  . Alcohol Use: No      Review of Systems A complete 10 system review of systems was obtained and all systems are negative except as noted in the HPI and PMH.   Allergies  Review of patient's allergies indicates no known allergies.  Home Medications   Current Outpatient Rx  Name Route Sig Dispense Refill  . ASPIRIN 325 MG PO TABS Oral Take 162.5 mg by mouth daily.    . OXYCONTIN PO Oral Take 1 tablet by mouth once as needed. For pain      BP 132/107  Pulse 72  Temp 97.5 F (36.4 C) (Oral)  Resp 20  Ht 6\' 1"  (1.854 m)  Wt 166 lb (75.297 kg)  BMI 21.90 kg/m2  SpO2 100%  Physical Exam  Nursing note and  vitals reviewed. Constitutional: He is oriented to person, place, and time. He appears well-developed and well-nourished. No distress.  HENT:  Head: Normocephalic and atraumatic.  Eyes: EOM are normal. Pupils are equal, round, and reactive to light.  Neck: Normal range of motion. Neck supple. No tracheal deviation present.  Cardiovascular: Normal rate, regular rhythm and normal heart sounds.   Pulmonary/Chest: Effort normal and breath sounds normal. No respiratory distress. He has no wheezes.  Abdominal: Soft. Bowel sounds are normal. He exhibits no distension. There is no tenderness.       Left CVA tenderness.   Genitourinary: Guaiac positive stool.       Hemoccult positive with black stools. Normal rectal tone. No testicle tenderness.   Musculoskeletal: Normal range of motion. He exhibits no edema.  Neurological: He is alert and oriented to person, place, and time.  Skin: Skin is warm and dry.  Psychiatric: He has a normal mood and affect. His behavior is normal.    ED Course  Procedures (including critical care time)  DIAGNOSTIC STUDIES: Oxygen Saturation is 100% on room air, normal by my interpretation.    COORDINATION OF CARE:  11:45-Medication Orders: 0.9% sodium chloride infusion-once  12:00-Discussed planned course of treatment with the patient, who is agreeable at this time.   12:15-Medication Orders: Pantoprazole (Protonix) injection 40 mg-once;  Sodium chloride 0.9% bolus 1,000 mL-once  13:00-Medication Orders: Ceftriaxone (Rocephin) 1 g in dextrose 5% 50 mL IVPB-once.   1:15-Consultation with Urology. Discussed pt's case with Dr. Jerre Simon who will see the pt today.   Results for orders placed during the hospital encounter of 12/10/11  URINALYSIS, ROUTINE W REFLEX MICROSCOPIC      Component Value Range   Color, Urine YELLOW  YELLOW   APPearance CLEAR  CLEAR   Specific Gravity, Urine >1.030 (*) 1.005 - 1.030   pH 5.5  5.0 - 8.0   Glucose, UA NEGATIVE  NEGATIVE mg/dL     Hgb urine dipstick SMALL (*) NEGATIVE   Bilirubin Urine NEGATIVE  NEGATIVE   Ketones, ur NEGATIVE  NEGATIVE mg/dL   Protein, ur NEGATIVE  NEGATIVE mg/dL   Urobilinogen, UA 0.2  0.0 - 1.0 mg/dL   Nitrite NEGATIVE  NEGATIVE   Leukocytes, UA TRACE (*) NEGATIVE  CBC WITH DIFFERENTIAL      Component Value Range   WBC 11.6 (*) 4.0 - 10.5 K/uL   RBC 4.71  4.22 - 5.81 MIL/uL   Hemoglobin 14.9  13.0 - 17.0 g/dL   HCT 62.1  30.8 - 65.7 %   MCV 90.2  78.0 - 100.0 fL   MCH 31.6  26.0 - 34.0 pg   MCHC 35.1  30.0 - 36.0 g/dL   RDW 84.6  96.2 - 95.2 %   Platelets 241  150 - 400 K/uL   Neutrophils Relative 92 (*) 43 - 77 %   Neutro Abs 10.7 (*) 1.7 - 7.7 K/uL   Lymphocytes Relative 5 (*) 12 - 46 %   Lymphs Abs 0.5 (*) 0.7 - 4.0 K/uL   Monocytes Relative 3  3 - 12 %   Monocytes Absolute 0.4  0.1 - 1.0 K/uL   Eosinophils Relative 0  0 - 5 %   Eosinophils Absolute 0.0  0.0 - 0.7 K/uL   Basophils Relative 0  0 - 1 %   Basophils Absolute 0.0  0.0 - 0.1 K/uL  BASIC METABOLIC PANEL      Component Value Range   Sodium 132 (*) 135 - 145 mEq/L   Potassium 4.0  3.5 - 5.1 mEq/L   Chloride 98  96 - 112 mEq/L   CO2 23  19 - 32 mEq/L   Glucose, Bld 131 (*) 70 - 99 mg/dL   BUN 24 (*) 6 - 23 mg/dL   Creatinine, Ser 8.41  0.50 - 1.35 mg/dL   Calcium 32.4  8.4 - 40.1 mg/dL   GFR calc non Af Amer 58 (*) >90 mL/min   GFR calc Af Amer 68 (*) >90 mL/min  PROTIME-INR      Component Value Range   Prothrombin Time 12.4  11.6 - 15.2 seconds   INR 0.93  0.00 - 1.49  TYPE AND SCREEN      Component Value Range   ABO/RH(D) O POS     Antibody Screen PENDING     Sample Expiration 12/13/2011    URINE MICROSCOPIC-ADD ON      Component Value Range   Squamous Epithelial / LPF FEW (*) RARE   WBC, UA 11-20  <3 WBC/hpf   RBC / HPF 7-10  <3 RBC/hpf   Bacteria, UA RARE  RARE     Ct Abdomen Pelvis Wo Contrast  12/10/2011  *RADIOLOGY REPORT*  Clinical Data: Left flank pain.  CT ABDOMEN AND PELVIS WITHOUT CONTRAST   Technique:  Multidetector CT imaging of the abdomen  and pelvis was performed following the standard protocol without intravenous contrast.  Comparison: 03/20/2009 CT.  Findings: 14.5 mm distal left ureteral obstructing stone located just proximal to the left ureteral vesicle junction.  Marked left- sided hydronephrosis with pararenal inflammation.  Left lower pole 12 mm nonobstructing stone.  Evaluation of solid abdominal viscera is limited by lack of IV contrast.  Taking this limitation into account no focal worrisome hepatic, splenic, pancreatic, renal or adrenal mass.  Coronary artery calcifications.  Atherosclerotic type changes of the aorta with ectasia.  Atherosclerotic type changes iliac arteries with ectasia  Decompressed bladder with markedly thickened wall.  Slight lobulated prostate gland.  Degenerative changes lower lumbar spine without bony destructive lesion.  Small granuloma right lung base with atelectatic changes/scarring.  Limited evaluation of bowel without gross abnormality.  Fluid within the distal esophagus may be related to reflux.  Sludge within the gallbladder without calcified stone.  IMPRESSION: 14.5 mm distal left ureteral obstructing stone located just proximal to the left ureteral vesicle junction.  Marked left-sided hydronephrosis with pararenal inflammation.  Left lower pole 12 mm nonobstructing stone.  Please see above.   Original Report Authenticated By: Fuller Canada, M.D.      No diagnosis found.    MDM  L flank pain that is colicky, associated with nausea and vomiting.  No fever.  Incidental finding of black stools on exam that are FOBT positive.  Large UVJ stone with obstruction. D/w Dr. Jerre Simon.  He will see patient today. Hemoglobin stable.  Rocephin given for pyruria. IVF, PPI, type and screen.  I personally performed the services described in this documentation, which was scribed in my presence.  The recorded information has been reviewed and  considered.        Glynn Octave, MD 12/10/11 1438

## 2011-12-10 NOTE — ED Notes (Signed)
Pt reports left flank pain that started yesterday and moved into groin area. Also having difficulty w/ urination.

## 2011-12-10 NOTE — H&P (Signed)
Hospital Admission Note Date: 12/10/2011  Patient name: Anthony Glass Medical record number: [id number] Date of birth: [birth year] Age: 66 y.o. Gender: male PCP: none  Attending physician: Christiane Ha, MD  Chief Complaint: left flank pain  History of Present Illness:  Anthony Glass is an 66 y.o. male who presents with sudden onset left flank pain yesterday. The pain moved down to his abdomen and testicular area today. He took an oxycodone prior to coming to the emergency room. He vomited once in the emergency room. He has a history of previous nephrolithiasis. Has a history of TURP and has seen Dr. Jerre Simon in the past. He's had no hematuria or dysuria. He's had no fevers or chills. CAT scan without contrast showed a 14.5 mm distal obstructing ureteral stone with hydronephrosis. In the ED, the ED physician did a rectal exam and found black heme positive stool which was tarry. The patient denies noting any blood in his stool, black tarry stools. He has not really stooled much over the past week or so. He's unsure whether he's ever had a colonoscopy. He's never had upper endoscopy. He reports that he sometimes gets a burning chest pain with lying supine, sometimes with exertion. He has not seen a physician for several years and has no primary care physician. He started taking aspirin empirically when he began having chest pain but never sought medical advice. He's never had a cardiac workup. He has no chest pain currently. No history of ulcers. The ED physician and spoke with Dr. Jesse Fall who will evaluate the patient today.  Past medical history: Benign prostatic hypertrophy, nephrolithiasis  Meds: Prescriptions prior to admission  Medication Sig Dispense Refill  . aspirin 325 MG tablet Take 162.5 mg by mouth daily.      . OxyCODONE HCl (OXYCONTIN PO) Take 1 tablet by mouth once as needed. For pain        Allergies: Review of patient's allergies indicates no known  allergies.  Social history: Works at [employer]. Does not smoke. Has no children. Is not married. Denies heavy drinking or drug use  Family history: Mother alive with diabetes  Past Surgical History  Procedure Date  . Cystoscopy prostate w/ laser     Review of Systems: Systems reviewed and as per HPI, otherwise negative.  Physical Exam: Blood pressure 133/89, pulse 66, temperature 99.2 F (37.3 C), temperature source Oral, resp. rate 20, height 6\' 1"  (1.854 m), weight 75.3 kg (166 lb 0.1 oz), SpO2 99.00%. BP 133/89  Pulse 66  Temp 99.2 F (37.3 C) (Oral)  Resp 20  Ht 6\' 1"  (1.854 m)  Wt 75.3 kg (166 lb 0.1 oz)  BMI 21.90 kg/m2  SpO2 99%  General Appearance:    Alert, cooperative, no distress, appears stated age  Head:    Normocephalic, without obvious abnormality, atraumatic  Eyes:    PERRL, conjunctiva/corneas clear, EOM's intact, fundi    benign, both eyes       Ears:    Normal TM's and external ear canals, both ears  Nose:   Nares normal, septum midline, mucosa normal, no drainage    or sinus tenderness  Throat:   Lips, mucosa, and tongue normal; teeth and gums normal  Neck:   Supple, symmetrical, trachea midline, no adenopathy;       thyroid:  No enlargement/tenderness/nodules; no carotid   bruit or JVD  Back:     Symmetric, no curvature, ROM normal, mild left CVA tenderness   Lungs:  Clear to auscultation bilaterally, respirations unlabored  Chest wall:    No tenderness or deformity  Heart:    Regular rate and rhythm, S1 and S2 normal, no murmur, rub   or gallop  Abdomen:     Soft, non-tender, bowel sounds active all four quadrants,    no masses, no organomegaly  Genitalia:    Per EDP, no testicular mass or tenderness  Rectal:  l  Per EDP, black heme positive tarry stool  Extremities:   Extremities normal, atraumatic, no cyanosis or edema  Pulses:   2+ and symmetric all extremities  Skin:   Skin color, texture, turgor normal, no rashes or lesions  Lymph nodes:    Cervical, supraclavicular, and axillary nodes normal  Neurologic:   CNII-XII intact. Normal strength, sensation and reflexes      throughout    Psychiatric: Flat affect. Poor eye contact. Speech so quietly difficult to understand.  Lab results: Basic Metabolic Panel:  Basename 12/10/11 1133  NA 132*  K 4.0  CL 98  CO2 23  GLUCOSE 131*  BUN 24*  CREATININE 1.25  CALCIUM 10.0  MG --  PHOS --   Liver Function Tests: No results found for this basename: AST:2,ALT:2,ALKPHOS:2,BILITOT:2,PROT:2,ALBUMIN:2 in the last 72 hours No results found for this basename: LIPASE:2,AMYLASE:2 in the last 72 hours No results found for this basename: AMMONIA:2 in the last 72 hours CBC:  Basename 12/10/11 1133  WBC 11.6*  NEUTROABS 10.7*  HGB 14.9  HCT 42.5  MCV 90.2  PLT 241   Cardiac Enzymes: No results found for this basename: CKTOTAL:3,CKMB:3,CKMBINDEX:3,TROPONINI:3 in the last 72 hours BNP: No results found for this basename: PROBNP:3 in the last 72 hours D-Dimer: No results found for this basename: DDIMER:2 in the last 72 hours CBG: No results found for this basename: GLUCAP:6 in the last 72 hours Hemoglobin A1C: No results found for this basename: HGBA1C in the last 72 hours Fasting Lipid Panel: No results found for this basename: CHOL,HDL,LDLCALC,TRIG,CHOLHDL,LDLDIRECT in the last 72 hours Thyroid Function Tests: No results found for this basename: TSH,T4TOTAL,FREET4,T3FREE,THYROIDAB in the last 72 hours Anemia Panel: No results found for this basename: VITAMINB12,FOLATE,FERRITIN,TIBC,IRON,RETICCTPCT in the last 72 hours Coagulation:  Basename 12/10/11 1203  LABPROT 12.4  INR 0.93   Urine Drug Screen: Drugs of Abuse  No results found for this basename: labopia, cocainscrnur, labbenz, amphetmu, thcu, labbarb    Alcohol Level: No results found for this basename: ETH:2 in the last 72 hours Urinalysis:  Basename 12/10/11 1140  COLORURINE YELLOW  LABSPEC >1.030*  PHURINE  5.5  GLUCOSEU NEGATIVE  HGBUR SMALL*  BILIRUBINUR NEGATIVE  KETONESUR NEGATIVE  PROTEINUR NEGATIVE  UROBILINOGEN 0.2  NITRITE NEGATIVE  LEUKOCYTESUR TRACE*   Imaging results:  Ct Abdomen Pelvis Wo Contrast  12/10/2011  *RADIOLOGY REPORT*  Clinical Data: Left flank pain.  CT ABDOMEN AND PELVIS WITHOUT CONTRAST  Technique:  Multidetector CT imaging of the abdomen and pelvis was performed following the standard protocol without intravenous contrast.  Comparison: 03/20/2009 CT.  Findings: 14.5 mm distal left ureteral obstructing stone located just proximal to the left ureteral vesicle junction.  Marked left- sided hydronephrosis with pararenal inflammation.  Left lower pole 12 mm nonobstructing stone.  Evaluation of solid abdominal viscera is limited by lack of IV contrast.  Taking this limitation into account no focal worrisome hepatic, splenic, pancreatic, renal or adrenal mass.  Coronary artery calcifications.  Atherosclerotic type changes of the aorta with ectasia.  Atherosclerotic type changes iliac arteries with ectasia  Decompressed bladder with markedly thickened wall.  Slight lobulated prostate gland.  Degenerative changes lower lumbar spine without bony destructive lesion.  Small granuloma right lung base with atelectatic changes/scarring.  Limited evaluation of bowel without gross abnormality.  Fluid within the distal esophagus may be related to reflux.  Sludge within the gallbladder without calcified stone.  IMPRESSION: 14.5 mm distal left ureteral obstructing stone located just proximal to the left ureteral vesicle junction.  Marked left-sided hydronephrosis with pararenal inflammation.  Left lower pole 12 mm nonobstructing stone.  Please see above.   Original Report Authenticated By: Fuller Canada, M.D.     Assessment & Plan: Principal Problem:  *Ureterolithiasis: Stone likely too big to pass. Await Dr. Sylvie Farrier evaluation. Will keep patient n.p.o. for now. Patient does have some white  cells in his urine. Will cover with Rocephin. IV fluids, pain and nausea medications Active Problems:  Heme positive stool, with normal blood pressure and hemoglobin. Patient reports burning type chest pain. Could be peptic ulcer disease. Will give empiric protonix. Follow hemoglobin. Monitor stools. GI evaluation, inpatient versus outpatient depending on severity of bleed.  Hyponatremia. Monitor Patient describes on review of systems chest pain which is not entirely typical of either GI or cardiac source. However, he does have coronary artery calcifications on CT abdomen. We'll check an echocardiogram in the EKG. Might need outpatient stress test. No aspirin for now.  Also, patient will need referral to a primary care physician.  Rayder Sullenger L 12/10/2011, 5:33 PM

## 2011-12-11 DIAGNOSIS — I319 Disease of pericardium, unspecified: Secondary | ICD-10-CM

## 2011-12-11 HISTORY — PX: TRANSTHORACIC ECHOCARDIOGRAM: SHX275

## 2011-12-11 LAB — CBC
Hemoglobin: 13.6 g/dL (ref 13.0–17.0)
Platelets: 223 10*3/uL (ref 150–400)
RBC: 4.36 MIL/uL (ref 4.22–5.81)
WBC: 9.6 10*3/uL (ref 4.0–10.5)

## 2011-12-11 LAB — BASIC METABOLIC PANEL
Calcium: 8.9 mg/dL (ref 8.4–10.5)
GFR calc Af Amer: 83 mL/min — ABNORMAL LOW (ref >90)
GFR calc non Af Amer: 71 mL/min — ABNORMAL LOW (ref >90)
Glucose, Bld: 116 mg/dL — ABNORMAL HIGH (ref 70–99)
Potassium: 3.9 mEq/L (ref 3.5–5.1)
Sodium: 135 mEq/L (ref 135–145)

## 2011-12-11 LAB — OCCULT BLOOD X 1 CARD TO LAB, STOOL: Fecal Occult Bld: NEGATIVE

## 2011-12-11 MED ORDER — SODIUM CHLORIDE 0.9 % IJ SOLN
INTRAMUSCULAR | Status: AC
  Administered 2011-12-11: 10 mL
  Filled 2011-12-11: qty 3

## 2011-12-11 MED ORDER — SODIUM CHLORIDE 0.9 % IJ SOLN
INTRAMUSCULAR | Status: AC
  Administered 2011-12-11: 3 mL
  Filled 2011-12-11: qty 3

## 2011-12-11 MED ORDER — PANTOPRAZOLE SODIUM 40 MG PO TBEC
40.0000 mg | DELAYED_RELEASE_TABLET | Freq: Every day | ORAL | Status: DC
  Administered 2011-12-13: 40 mg via ORAL
  Filled 2011-12-11: qty 1

## 2011-12-11 MED ORDER — DEXTROSE 5 % IV SOLN
1.0000 g | INTRAVENOUS | Status: DC
  Administered 2011-12-11 – 2011-12-13 (×3): 1 g via INTRAVENOUS
  Filled 2011-12-11 (×4): qty 10

## 2011-12-11 MED ORDER — ACETAMINOPHEN 10 MG/ML IV SOLN
INTRAVENOUS | Status: AC
  Filled 2011-12-11: qty 200

## 2011-12-11 MED ORDER — ACETAMINOPHEN 10 MG/ML IV SOLN
1000.0000 mg | Freq: Four times a day (QID) | INTRAVENOUS | Status: AC
  Administered 2011-12-11 – 2011-12-12 (×3): 1000 mg via INTRAVENOUS
  Filled 2011-12-11 (×4): qty 100

## 2011-12-11 MED ORDER — CIPROFLOXACIN IN D5W 200 MG/100ML IV SOLN
200.0000 mg | Freq: Once | INTRAVENOUS | Status: DC
  Filled 2011-12-11: qty 100

## 2011-12-11 NOTE — Progress Notes (Signed)
  Echocardiogram 2D Echocardiogram has been performed.  Antony Salmon, RCS 12/11/2011, 9:59 AM

## 2011-12-11 NOTE — Consult Note (Signed)
Dictation#[id number]

## 2011-12-11 NOTE — Care Management Note (Unsigned)
    Page 1 of 1   12/12/2011     12:02:34 PM   CARE MANAGEMENT NOTE 12/12/2011  Patient:  Anthony Glass, Anthony Glass   Account Number:  [id number]  Date Initiated:  12/11/2011  Documentation initiated by:  Rosemary Holms  Subjective/Objective Assessment:   Pt admitted from home alone. He states he has two kidney stones. Does not anticipate HH needs     Action/Plan:   Needs a PCP and would like one that will see him inpt and outpt. List of physicians discussed with pt. Three identified and CM will call for availability   Anticipated DC Date:  12/13/2011   Anticipated DC Plan:  HOME/SELF CARE      DC Planning Services  CM consult      Choice offered to / List presented to:             Status of service:  In process, will continue to follow Medicare Important Message given?  YES (If response is "NO", the following Medicare IM given date fields will be blank) Date Medicare IM given:  12/11/2011 Date Additional Medicare IM given:    Discharge Disposition:    Per UR Regulation:    If discussed at Long Length of Stay Meetings, dates discussed:    Comments:  12/12/11 1115 Jaslynne Dahan RN BSN CM Spoke to pt prior to his procedure. Dr. Felecia Shelling has agreed to see pt as his PCP and an appt has been made for 12/19/11 at 1pm for new pt/ hospital F/u. CM explained to pt that Dr. Sherrie Mustache wanted him to f/u with a GI specialist following DC. Dr. Jena Gauss and Rehman's numbers given to pt to follow up and schedule appt w/in a month. He stated he would and appreciative.  12/11/11/1000 Rosemary Holms RN BSN CM

## 2011-12-11 NOTE — Progress Notes (Signed)
UR Chart Review Completed  

## 2011-12-11 NOTE — Progress Notes (Signed)
No pain today. Stone rather large will not pass spontaneously. Two options esl or stone basket. i will recommend stone basket as stone is large and close to bladder . He  comlications of stone basket ureteral perforation leading to open surgery dis cussed also discussed stone migration and use of double j stent. He wants me to proceed. Will schedule for am.

## 2011-12-11 NOTE — Progress Notes (Signed)
12/11/11 1813 Patient had medium, formed, soft, dark stool. Stool sent to lab for hemoccult as ordered. Notified Dr Lendell Caprice, order received for CBC in the morning. Riccardo Dubin

## 2011-12-11 NOTE — Progress Notes (Signed)
Subjective: No further pain.  No vomiting. No stools.  Objective: Vital signs in last 24 hours: Filed Vitals:   12/10/11 1528 12/11/11 0215 12/11/11 0428 12/11/11 1400  BP: 133/89 100/65 98/64 103/64  Pulse: 66 85 75 70  Temp: 99.2 F (37.3 C) 98.2 F (36.8 C) 98.7 F (37.1 C) 98.1 F (36.7 C)  TempSrc: Oral Oral Oral   Resp: 20 20 20 20   Height: 6\' 1"  (1.854 m)     Weight: 75.3 kg (166 lb 0.1 oz)     SpO2: 99% 92% 97% 96%   Weight change:   Intake/Output Summary (Last 24 hours) at 12/11/11 1639 Last data filed at 12/11/11 1545  Gross per 24 hour  Intake    843 ml  Output      0 ml  Net    843 ml   Gen: asleep, arousable, comfortable Lungs CTA CV RRR abd s, nt, nd Ext no cce  Lab Results: Basic Metabolic Panel:  Lab 12/11/11 1610 12/10/11 1133  NA 135 132*  K 3.9 4.0  CL 103 98  CO2 25 23  GLUCOSE 116* 131*  BUN 19 24*  CREATININE 1.06 1.25  CALCIUM 8.9 10.0  MG -- --  PHOS -- --   Liver Function Tests:  Lab 12/10/11 1740  AST 23  ALT 23  ALKPHOS 72  BILITOT 0.3  PROT 7.0  ALBUMIN 4.0   No results found for this basename: LIPASE:2,AMYLASE:2 in the last 168 hours No results found for this basename: AMMONIA:2 in the last 168 hours CBC:  Lab 12/11/11 0503 12/10/11 1133  WBC 9.6 11.6*  NEUTROABS -- 10.7*  HGB 13.6 14.9  HCT 39.2 42.5  MCV 89.9 90.2  PLT 223 241   Coagulation:  Lab 12/10/11 1203  LABPROT 12.4  INR 0.93   Urinalysis:  Lab 12/10/11 1140  COLORURINE YELLOW  LABSPEC >1.030*  PHURINE 5.5  GLUCOSEU NEGATIVE  HGBUR SMALL*  BILIRUBINUR NEGATIVE  KETONESUR NEGATIVE  PROTEINUR NEGATIVE  UROBILINOGEN 0.2  NITRITE NEGATIVE  LEUKOCYTESUR TRACE*   Micro Results: No results found for this or any previous visit (from the past 240 hour(s)). Studies/Results: Ct Abdomen Pelvis Wo Contrast  12/10/2011  *RADIOLOGY REPORT*  Clinical Data: Left flank pain.  CT ABDOMEN AND PELVIS WITHOUT CONTRAST  Technique:  Multidetector CT  imaging of the abdomen and pelvis was performed following the standard protocol without intravenous contrast.  Comparison: 03/20/2009 CT.  Findings: 14.5 mm distal left ureteral obstructing stone located just proximal to the left ureteral vesicle junction.  Marked left- sided hydronephrosis with pararenal inflammation.  Left lower pole 12 mm nonobstructing stone.  Evaluation of solid abdominal viscera is limited by lack of IV contrast.  Taking this limitation into account no focal worrisome hepatic, splenic, pancreatic, renal or adrenal mass.  Coronary artery calcifications.  Atherosclerotic type changes of the aorta with ectasia.  Atherosclerotic type changes iliac arteries with ectasia  Decompressed bladder with markedly thickened wall.  Slight lobulated prostate gland.  Degenerative changes lower lumbar spine without bony destructive lesion.  Small granuloma right lung base with atelectatic changes/scarring.  Limited evaluation of bowel without gross abnormality.  Fluid within the distal esophagus may be related to reflux.  Sludge within the gallbladder without calcified stone.  IMPRESSION: 14.5 mm distal left ureteral obstructing stone located just proximal to the left ureteral vesicle junction.  Marked left-sided hydronephrosis with pararenal inflammation.  Left lower pole 12 mm nonobstructing stone.  Please see above.   Original  Report Authenticated By: Fuller Canada, M.D.    Dg Abd 1 View  12/10/2011  *RADIOLOGY REPORT*  Clinical Data: 66 year old male left ureteral calculus.  ABDOMEN - 1 VIEW  Comparison: CT abdomen pelvis 1241 hours the same day.  Findings: Large obstructing distal left ureteral calculus appears grossly stable in position from earlier CT.  Additional large left lower pole calculus re-identified. Nonobstructed bowel gas pattern. Stable visualized osseous structures.  Calcified atherosclerosis.  IMPRESSION: Suspect stable position of the large obstructing distal left ureteral calculus.    Original Report Authenticated By: Ulla Potash III, M.D.    Scheduled Meds:   . pantoprazole  40 mg Oral Q1200  . sodium chloride      . sodium chloride      . DISCONTD: sodium chloride   Intravenous STAT  . DISCONTD: pantoprazole (PROTONIX) IV  40 mg Intravenous Q12H   Continuous Infusions:   . DISCONTD: dextrose 5 % and 0.9% NaCl 100 mL/hr at 12/11/11 1327   PRN Meds:.morphine injection, ondansetron (ZOFRAN) IV, ondansetron, DISCONTD:  HYDROmorphone (DILAUDID) injection, DISCONTD: ondansetron (ZOFRAN) IV Assessment/Plan: Principal Problem:  *Ureterolithiasis:  Stone still present on KUB.  Await decision by [facility]. Continue rocephin for now Active Problems:  Heme positive stool: no stool today, BP and hgb stable.  Change empiric PPI to po.  Outpatient referral to GI, unless significant GIB documented  Hyponatremia Echo pending (see H&P)   LOS: 1 day   Braedin Millhouse L 12/11/2011, 4:39 PM

## 2011-12-11 NOTE — Plan of Care (Signed)
Problem: Phase III Progression Outcomes Goal: Pain controlled on oral analgesia Outcome: Not Applicable Date Met:  12/11/11 9/23/  No c/o pain or discomfort today, instructed to notify nursing staff of any pain or need for pain medication, stated would do so. Riccardo Dubin

## 2011-12-12 ENCOUNTER — Encounter (HOSPITAL_COMMUNITY)
Admission: EM | Disposition: A | Discharge status: Home / Self Care | Payer: Self-pay | Source: Home / Self Care | Attending: Internal Medicine

## 2011-12-12 ENCOUNTER — Inpatient Hospital Stay (HOSPITAL_COMMUNITY): Payer: Medicare Other | Admitting: Anesthesiology

## 2011-12-12 ENCOUNTER — Encounter (HOSPITAL_COMMUNITY): Payer: Self-pay | Admitting: Internal Medicine

## 2011-12-12 ENCOUNTER — Inpatient Hospital Stay (HOSPITAL_COMMUNITY): Payer: Medicare Other

## 2011-12-12 ENCOUNTER — Encounter (HOSPITAL_COMMUNITY): Payer: Self-pay | Admitting: Anesthesiology

## 2011-12-12 DIAGNOSIS — N390 Urinary tract infection, site not specified: Secondary | ICD-10-CM | POA: Diagnosis present

## 2011-12-12 DIAGNOSIS — I5189 Other ill-defined heart diseases: Secondary | ICD-10-CM

## 2011-12-12 DIAGNOSIS — I519 Heart disease, unspecified: Secondary | ICD-10-CM

## 2011-12-12 HISTORY — PX: STONE EXTRACTION WITH BASKET: SHX5318

## 2011-12-12 HISTORY — DX: Other ill-defined heart diseases: I51.89

## 2011-12-12 HISTORY — PX: CYSTOSCOPY W/ URETEROSCOPY W/ LITHOTRIPSY: SUR380

## 2011-12-12 LAB — CBC WITH DIFFERENTIAL/PLATELET
Basophils Absolute: 0 10*3/uL (ref 0.0–0.1)
Eosinophils Relative: 0 % (ref 0–5)
HCT: 38.5 % — ABNORMAL LOW (ref 39.0–52.0)
Hemoglobin: 13.2 g/dL (ref 13.0–17.0)
Lymphocytes Relative: 12 % (ref 12–46)
Lymphs Abs: 0.9 10*3/uL (ref 0.7–4.0)
MCV: 90.8 fL (ref 78.0–100.0)
Monocytes Absolute: 0.9 10*3/uL (ref 0.1–1.0)
Monocytes Relative: 12 % (ref 3–12)
Neutro Abs: 5.4 10*3/uL (ref 1.7–7.7)
RBC: 4.24 MIL/uL (ref 4.22–5.81)
RDW: 14 % (ref 11.5–15.5)
WBC: 7.1 10*3/uL (ref 4.0–10.5)

## 2011-12-12 SURGERY — CYSTOURETEROSCOPY, WITH RETROGRADE PYELOGRAM AND STENT INSERTION
Anesthesia: Spinal | Laterality: Left | Wound class: Clean Contaminated

## 2011-12-12 MED ORDER — PROPOFOL 10 MG/ML IV EMUL
INTRAVENOUS | Status: AC
  Filled 2011-12-12: qty 20

## 2011-12-12 MED ORDER — MEPERIDINE HCL 50 MG/ML IJ SOLN
12.5000 mg | Freq: Once | INTRAMUSCULAR | Status: AC
  Administered 2011-12-12: 12.5 mg via INTRAVENOUS

## 2011-12-12 MED ORDER — SODIUM CHLORIDE 0.9 % IR SOLN
Status: DC | PRN
  Administered 2011-12-12 (×5): 3000 mL

## 2011-12-12 MED ORDER — FENTANYL CITRATE 0.05 MG/ML IJ SOLN
INTRAMUSCULAR | Status: AC
  Filled 2011-12-12: qty 5

## 2011-12-12 MED ORDER — LACTATED RINGERS IV SOLN
INTRAVENOUS | Status: DC
  Administered 2011-12-12: 12:00:00 via INTRAVENOUS

## 2011-12-12 MED ORDER — FENTANYL CITRATE 0.05 MG/ML IJ SOLN
INTRAMUSCULAR | Status: DC | PRN
  Administered 2011-12-12: 50 ug via INTRAVENOUS
  Administered 2011-12-12: 30 ug via INTRAVENOUS
  Administered 2011-12-12: 20 ug via INTRATHECAL

## 2011-12-12 MED ORDER — MEPERIDINE HCL 50 MG/ML IJ SOLN
INTRAMUSCULAR | Status: AC
  Filled 2011-12-12: qty 1

## 2011-12-12 MED ORDER — IOHEXOL 350 MG/ML SOLN
INTRAVENOUS | Status: DC | PRN
  Administered 2011-12-12: 50 mL

## 2011-12-12 MED ORDER — PROPOFOL 10 MG/ML IV BOLUS
INTRAVENOUS | Status: DC | PRN
  Administered 2011-12-12: 20 mg via INTRAVENOUS

## 2011-12-12 MED ORDER — FENTANYL CITRATE 0.05 MG/ML IJ SOLN: 25.0000 ug | INTRAMUSCULAR | Status: DC | PRN

## 2011-12-12 MED ORDER — MIDAZOLAM HCL 2 MG/2ML IJ SOLN
1.0000 mg | INTRAMUSCULAR | Status: DC | PRN
  Administered 2011-12-12: 2 mg via INTRAVENOUS

## 2011-12-12 MED ORDER — DEXTROSE-NACL 5-0.45 % IV SOLN
INTRAVENOUS | Status: DC
  Administered 2011-12-12 – 2011-12-13 (×3): via INTRAVENOUS

## 2011-12-12 MED ORDER — STERILE WATER FOR IRRIGATION IR SOLN
Status: DC | PRN
  Administered 2011-12-12: 1000 mL

## 2011-12-12 MED ORDER — ONDANSETRON HCL 4 MG/2ML IJ SOLN: 4.0000 mg | Freq: Once | INTRAMUSCULAR | Status: DC | PRN

## 2011-12-12 MED ORDER — SODIUM CHLORIDE 0.9 % IV SOLN: 12.5000 mg | Freq: Once | INTRAVENOUS | Status: DC

## 2011-12-12 MED ORDER — LIDOCAINE HCL (PF) 1 % IJ SOLN
INTRAMUSCULAR | Status: AC
  Filled 2011-12-12: qty 5

## 2011-12-12 MED ORDER — PROPOFOL INFUSION 10 MG/ML OPTIME
INTRAVENOUS | Status: DC | PRN
  Administered 2011-12-12: 40 ug/kg/min via INTRAVENOUS

## 2011-12-12 MED ORDER — ONDANSETRON HCL 4 MG/2ML IJ SOLN
4.0000 mg | Freq: Once | INTRAMUSCULAR | Status: AC
  Administered 2011-12-12: 4 mg via INTRAVENOUS

## 2011-12-12 MED ORDER — BUPIVACAINE IN DEXTROSE 0.75-8.25 % IT SOLN
INTRATHECAL | Status: AC
  Filled 2011-12-12: qty 2

## 2011-12-12 MED ORDER — BUPIVACAINE IN DEXTROSE 0.75-8.25 % IT SOLN
INTRATHECAL | Status: DC | PRN
  Administered 2011-12-12: 35 mg via INTRATHECAL

## 2011-12-12 MED ORDER — MORPHINE SULFATE 4 MG/ML IJ SOLN
2.0000 mg | INTRAMUSCULAR | Status: DC | PRN
  Administered 2011-12-13: 2 mg via INTRAVENOUS
  Filled 2011-12-12: qty 1

## 2011-12-12 SURGICAL SUPPLY — 25 items
BAG DRAIN URO TABLE W/ADPT NS (DRAPE) ×2 IMPLANT
BAG DRN 8 ADPR NS SKTRN CSTL (DRAPE) ×1
BASKET GEMINI (MISCELLANEOUS) ×1 IMPLANT
CATH 5 FR WEDGE TIP (UROLOGICAL SUPPLIES) ×2 IMPLANT
CATH OPEN FLEX TIP 5FR (CATHETERS) ×1 IMPLANT
CATH OPEN TIP 5FR (CATHETERS) ×2 IMPLANT
CLOTH BEACON ORANGE TIMEOUT ST (SAFETY) ×2 IMPLANT
DILATOR UROMAX ULTRA (MISCELLANEOUS) ×1 IMPLANT
GLOVE BIO SURGEON STRL SZ7 (GLOVE) ×2 IMPLANT
GLOVE BIOGEL PI IND STRL 7.0 (GLOVE) IMPLANT
GLOVE BIOGEL PI INDICATOR 7.0 (GLOVE) ×2
GLOVE ECLIPSE 6.5 STRL STRAW (GLOVE) ×1 IMPLANT
GOWN STRL REIN XL XLG (GOWN DISPOSABLE) ×2 IMPLANT
GUIDEWIRE ANG ZIPWIRE 038X150 (WIRE) ×1 IMPLANT
IV NS IRRIG 3000ML ARTHROMATIC (IV SOLUTION) ×7 IMPLANT
KIT ROOM TURNOVER AP CYSTO (KITS) ×2 IMPLANT
LASER FIBER DISP (UROLOGICAL SUPPLIES) ×1 IMPLANT
LASER FIBER DISP 1000U (UROLOGICAL SUPPLIES) IMPLANT
MANIFOLD NEPTUNE II (INSTRUMENTS) ×2 IMPLANT
PACK CYSTO (CUSTOM PROCEDURE TRAY) ×2 IMPLANT
PAD ARMBOARD 7.5X6 YLW CONV (MISCELLANEOUS) ×2 IMPLANT
STENT PERCUFLEX 4.8FRX24 (STENTS) ×1 IMPLANT
STONE RETRIEVAL GEMINI 2.4 FR (MISCELLANEOUS) ×2 IMPLANT
TOWEL OR 17X26 4PK STRL BLUE (TOWEL DISPOSABLE) ×2 IMPLANT
WIRE GUIDE BENTSON .035 15CM (WIRE) ×3 IMPLANT

## 2011-12-12 NOTE — Anesthesia Preprocedure Evaluation (Addendum)
Anesthesia Evaluation  Patient identified by MRN, date of birth, ID band Patient awake    Reviewed: Allergy & Precautions, H&P , NPO status , Patient's Chart, lab work & pertinent test results  History of Anesthesia Complications Negative for: history of anesthetic complications  Airway Mallampati: II      Dental  (+) Teeth Intact   Pulmonary neg pulmonary ROS,  breath sounds clear to auscultation        Cardiovascular negative cardio ROS  Rhythm:Regular     Neuro/Psych    GI/Hepatic negative GI ROS,   Endo/Other    Renal/GU Renal disease     Musculoskeletal   Abdominal   Peds  Hematology   Anesthesia Other Findings   Reproductive/Obstetrics                           Anesthesia Physical Anesthesia Plan  ASA: II  Anesthesia Plan: Spinal   Post-op Pain Management:    Induction:   Airway Management Planned: Nasal Cannula  Additional Equipment:   Intra-op Plan:   Post-operative Plan:   Informed Consent: I have reviewed the patients History and Physical, chart, labs and discussed the procedure including the risks, benefits and alternatives for the proposed anesthesia with the patient or authorized representative who has indicated his/her understanding and acceptance.     Plan Discussed with:   Anesthesia Plan Comments:         Anesthesia Quick Evaluation

## 2011-12-12 NOTE — Progress Notes (Signed)
Subjective: (Delayed entry. The patient was seen earlier this morning. He is now status post urological procedure). This morning, the patient had no complaints of chest pain, shortness of breath, or flank pain. He denied blood in his urine. No stool this morning.  Objective: Vital signs in last 24 hours: Filed Vitals:   12/12/11 1530 12/12/11 1546 12/12/11 1612 12/12/11 1623  BP: 145/94 139/58 130/88 130/89  Pulse: 99 105 109 112  Temp:  97.9 F (36.6 C) 97.8 F (36.6 C)   TempSrc:      Resp: 21 18 18 18   Height:      Weight:      SpO2: 95% 98% 89% 88%    Intake/Output Summary (Last 24 hours) at 12/12/11 1655 Last data filed at 12/12/11 1422  Gross per 24 hour  Intake    853 ml  Output    250 ml  Net    603 ml    Weight change:   Physical exam: General: Pleasant 66 year old Caucasian man laying in bed, in no acute distress. Lungs: Clear to auscultation bilaterally. Heart: S1, S2, with no murmurs rubs or gallops. Abdomen: Positive bowel sounds, soft, nontender, nondistended. No flank tenderness. Extremities: No pedal edema.  Lab Results: Basic Metabolic Panel:  Basename 12/11/11 0503 12/10/11 1133  NA 135 132*  K 3.9 4.0  CL 103 98  CO2 25 23  GLUCOSE 116* 131*  BUN 19 24*  CREATININE 1.06 1.25  CALCIUM 8.9 10.0  MG -- --  PHOS -- --   Liver Function Tests:  Basename 12/10/11 1740  AST 23  ALT 23  ALKPHOS 72  BILITOT 0.3  PROT 7.0  ALBUMIN 4.0   No results found for this basename: LIPASE:2,AMYLASE:2 in the last 72 hours No results found for this basename: AMMONIA:2 in the last 72 hours CBC:  Basename 12/12/11 0436 12/11/11 0503 12/10/11 1133  WBC 7.1 9.6 --  NEUTROABS 5.4 -- 10.7*  HGB 13.2 13.6 --  HCT 38.5* 39.2 --  MCV 90.8 89.9 --  PLT 189 223 --   Cardiac Enzymes: No results found for this basename: CKTOTAL:3,CKMB:3,CKMBINDEX:3,TROPONINI:3 in the last 72 hours BNP: No results found for this basename: PROBNP:3 in the last 72  hours D-Dimer: No results found for this basename: DDIMER:2 in the last 72 hours CBG: No results found for this basename: GLUCAP:6 in the last 72 hours Hemoglobin A1C: No results found for this basename: HGBA1C in the last 72 hours Fasting Lipid Panel: No results found for this basename: CHOL,HDL,LDLCALC,TRIG,CHOLHDL,LDLDIRECT in the last 72 hours Thyroid Function Tests: No results found for this basename: TSH,T4TOTAL,FREET4,T3FREE,THYROIDAB in the last 72 hours Anemia Panel: No results found for this basename: VITAMINB12,FOLATE,FERRITIN,TIBC,IRON,RETICCTPCT in the last 72 hours Coagulation:  Basename 12/10/11 1203  LABPROT 12.4  INR 0.93   Urine Drug Screen: Drugs of Abuse  No results found for this basename: labopia, cocainscrnur, labbenz, amphetmu, thcu, labbarb    Alcohol Level: No results found for this basename: ETH:2 in the last 72 hours Urinalysis:  Basename 12/10/11 1140  COLORURINE YELLOW  LABSPEC >1.030*  PHURINE 5.5  GLUCOSEU NEGATIVE  HGBUR SMALL*  BILIRUBINUR NEGATIVE  KETONESUR NEGATIVE  PROTEINUR NEGATIVE  UROBILINOGEN 0.2  NITRITE NEGATIVE  LEUKOCYTESUR TRACE*   Misc. Labs: Echo: Study Conclusions  - Left ventricle: The cavity size was normal. Wall thickness was normal. Systolic function was normal. The estimated ejection fraction was in the range of 60% to 65%. Wall motion was normal; there were no regional wall motion  abnormalities. Doppler parameters are consistent with abnormal left ventricular relaxation (grade 1 diastolic dysfunction). - Mitral valve: Calcified annulus. Trivial regurgitation. - Right ventricle: The cavity size was moderately dilated. Systolic function was mildly reduced. - Tricuspid valve: Trivial regurgitation. - Inferior vena cava: The vessel was mildly dilated; the respirophasic diameter changes were in the normal range (= 50%). - Pericardium, extracardiac: A small pericardial effusion was identified anterior to the  heart. Transthoracic echocardiography. M-mode, complete 2D, spectral Doppler, and color Doppler. Height: Height: 185.4cm. Height: 73in. Weight: Weight: 75.3kg. Weight: 165.7lb. Body mass index: BMI: 21.9kg/m^2. Body surface area: BSA: 1.40m^2. Patient status: Inpatient. Location: Bedside.    Echo:  Micro: Recent Results (from the past 240 hour(s))  MRSA PCR SCREENING     Status: Normal   Collection Time   12/11/11 11:23 PM      Component Value Range Status Comment   MRSA by PCR NEGATIVE  NEGATIVE Final     Studies/Results: Dg Abd 1 View  12/10/2011  *RADIOLOGY REPORT*  Clinical Data: 66 year old male left ureteral calculus.  ABDOMEN - 1 VIEW  Comparison: CT abdomen pelvis 1241 hours the same day.  Findings: Large obstructing distal left ureteral calculus appears grossly stable in position from earlier CT.  Additional large left lower pole calculus re-identified. Nonobstructed bowel gas pattern. Stable visualized osseous structures.  Calcified atherosclerosis.  IMPRESSION: Suspect stable position of the large obstructing distal left ureteral calculus.   Original Report Authenticated By: Harley Hallmark, M.D.    Dg Retrograde Pyelogram  12/12/2011  *RADIOLOGY REPORT*  Clinical Data: Left ureteral stent.  RETROGRADE PYELOGRAM  Comparison: CT 12/10/2011  Findings: Multiple images are submitted from the left retrograde pyelogram.  These demonstrate opacification of a dilated left renal pelvis and collecting system.  Final images demonstrate placement of left ureteral stent.  IMPRESSION: As above.   Original Report Authenticated By: Cyndie Chime, M.D.     Medications:  Scheduled:   . acetaminophen  1,000 mg Intravenous Q6H  . cefTRIAXone (ROCEPHIN)  IV  1 g Intravenous Q24H  . meperidine (DEMEROL) injection  12.5 mg Intravenous Once  . meperidine (DEMEROL) injection  12.5 mg Intravenous Once  . ondansetron (ZOFRAN) IV  4 mg Intravenous Once  . pantoprazole  40 mg Oral Q1200  . sodium  chloride      . DISCONTD: ciprofloxacin  200 mg Intravenous Once  . DISCONTD: meperidine (DEMEROL) 1 mg/mL infusion  12.5 mg Intravenous Once   Continuous:   . dextrose 5 % and 0.45% NaCl 100 mL/hr at 12/12/11 1646  . DISCONTD: lactated ringers     AVW:UJWJXBJY injection, ondansetron (ZOFRAN) IV, ondansetron, DISCONTD: fentaNYL, DISCONTD: iohexol, DISCONTD: midazolam, DISCONTD:  morphine injection, DISCONTD: ondansetron (ZOFRAN) IV, DISCONTD: sodium chloride irrigation, DISCONTD: sterile water for irrigation  Assessment: Principal Problem:  *Ureterolithiasis Active Problems:  Heme positive stool  Hyponatremia   1. Left ureterolithiasis. Status post cystoscopy, pyelogram, uroscopy and stent placement on the left with stone extraction with basket, per Dr. Jerre Simon today. We'll continue supportive treatment.  Pyuria/hematuria. In this clinical setting, he is being treated for urinary tract infection. We'll continue Rocephin.  Hyponatremia. Resolved with IV fluid hydration.  Heme positive stool. Second stool was guaiac negative. His hemoglobin has remained stable over the past 24 hours, but did have a decrease from admission due to to hemodilution from IV fluids. He will need to be evaluated by GI electively, but not emergently.  Review of systems positive for chest pain. The patient has  had no chest pain during the hospitalization. Ejection fraction within normal limits. Echo noted for diastolic dysfunction but no clinical evidence of heart failure.    Plan: 1. Continue current management. 2. We'll check laboratory studies in the morning. 3. Urological postop followup and instructions per Dr. Jerre Simon.      LOS: 2 days   Anthony Glass 12/12/2011, 4:55 PM

## 2011-12-12 NOTE — Progress Notes (Signed)
Called into patients room due to complaints of "bleeding". Patient resting comfortably in bed stated that he thought it was coming from his penis. ~200cc of bloody urine noted in urinal and patients sheets blood stained including large blood clot. Patient had no complaints. Dr. Jerre Simon notified of patients condition. No new orders at this time. Will continue to monitor.

## 2011-12-12 NOTE — Progress Notes (Addendum)
Spoke with patient regarding informed consent. Was not signed by witness last night. Patient confirmed that it was his signature and that he agreed to have Dr. Jerre Simon perform surgery today to remove kidney stones.

## 2011-12-12 NOTE — Progress Notes (Signed)
Called to room by NT. Moderate amount of blood on pad and patient states he passed bright red blood with another clot when voiding. States he felt slightly dizzy. BP 88/61. Dr Jerre Simon notified. Orders given for stat H and H. Call results if hematacrit less then 30. Increased IV fluids to 150 ml hr. Instructed to call for assistance to bathroom. Voiced understanding. Bed alarm on.

## 2011-12-12 NOTE — Progress Notes (Signed)
Stat H & H 12.6 /36.7. BP recheck was 87/61. Will continue to monitor.

## 2011-12-12 NOTE — Transfer of Care (Signed)
  Anesthesia Post-op Note  Patient: Anthony Glass  Procedure(s) Performed: Procedure(s) (LRB) with comments: CYSTOSCOPY WITH RETROGRADE PYELOGRAM, URETEROSCOPY AND STENT PLACEMENT (Left) STONE EXTRACTION WITH BASKET (Left) HOLMIUM LASER APPLICATION (Left)  Patient Location: PACU  Anesthesia Type: Spinal  Level of Consciousness: awake, alert , oriented and patient cooperative  Airway and Oxygen Therapy: Patient Spontanous Breathing and Patient connected to nasal cannula  Post-op Pain: mild  Post-op Assessment: Post-op Vital signs reviewed, Patient's Cardiovascular Status Stable, Respiratory Function Stable, Patent Airway and No signs of Nausea or vomiting  Post-op Vital Signs: Reviewed and stable  Complications: No apparent anesthesia complications

## 2011-12-12 NOTE — Anesthesia Postprocedure Evaluation (Signed)
  Anesthesia Post-op Note  Patient: Anthony Glass  Procedure(s) Performed: Procedure(s) (LRB) with comments: CYSTOSCOPY WITH RETROGRADE PYELOGRAM, URETEROSCOPY AND STENT PLACEMENT (Left) STONE EXTRACTION WITH BASKET (Left) HOLMIUM LASER APPLICATION (Left)  Patient Location: PACU  Anesthesia Type: Spinal  Level of Consciousness: awake, alert , oriented and patient cooperative  Airway and Oxygen Therapy: Patient Spontanous Breathing and Patient connected to nasal cannula oxygen  Post-op Pain: none  Post-op Assessment: Post-op Vital signs reviewed, Patient's Cardiovascular Status Stable, Respiratory Function Stable, Patent Airway and No signs of Nausea or vomiting  Post-op Vital Signs: Reviewed and stable  Complications: No apparent anesthesia complications

## 2011-12-12 NOTE — Consult Note (Signed)
NAME:  Anthony Glass, Anthony Glass           ACCOUNT NO.:  [id number]  MEDICAL RECORD NO.:  [id number]  LOCATION:                                 FACILITY:  PHYSICIAN:  Ky Barban, M.D.DATE OF BIRTH:  [birth year]  DATE OF CONSULTATION:  12/11/2011 DATE OF DISCHARGE:                                CONSULTATION   CHIEF COMPLAINT:  Left renal colic.  HISTORY:  A 66 year old gentleman who was well known to me.  I am following him for the last several years.  In 2005, he was referred to me by the [facility]  At that time, he was found to have a urethral stricture.  I did optical urethrotomy.  Subsequently, he never came back till 2011, when he was found to be in renal failure and at that time, he had marked bilateral hydroureteronephrosis, and I had done insertion of suprapubic catheter and subsequently in 2011, he underwent urethral stricture repaired with urethroplasty.  Postoperatively, he did fine. The wound healed up nicely, and the urethroplasty site also looked fine, but he was still in urinary retention because he had BPH and suprapubic catheter was still in place.  I tried him on Flomax and Rapaflo, but he was unable to void.  He was getting large amount of residual urine, and urethroplasty had healed up nicely.  So, I recommended that we go ahead with TUR prostate, so he underwent TUR prostate.  Subsequently, found to have BPH.  Once he had a TUR prostate, he was able to void satisfactorily, so we removed his suprapubic catheter, and he is voiding satisfactorily.  Even at that time, he had a stone in his lower pole calyx on the left side, but no hydronephrosis.  Now, he presented with left renal colic.  He has almost 1 cm size stone in the distal left ureter, causing obstruction.  There is no stone on the right side.  He is not running any fever or chills.  I also want to mention that when he had a TUR prostate which was done on July 14, 2010, he was also found to have a  stone in his bladder and I did a litholapaxy with TUR prostate.  Last time, I had seen him for followup was in January 2013, and when I had seen him, he had no voiding problems.  Now, I am seeing him in the hospital.  His PSA on April 12, 2011, was 1.3.  PAST MEDICAL HISTORY:  No other significant medical problem, except history of having urethral stricture, and left renal calculi, and also having BPH.  No history of diabetes or hypertension.  PERSONAL HISTORY:  Does not smoke or drink.  REVIEW OF SYSTEMS:  Unremarkable.  PHYSICAL EXAMINATION:  GENERAL:  Moderately built male, not in acute distress, fully conscious, alert, and oriented. VITAL SIGNS:  Blood pressure 130/80, temperature is normal. CENTRAL NERVOUS SYSTEM:  No gross neurological deficit. HEAD, NECK, EYE, ENT:  Negative. CHEST:  Symmetrical. HEART:  Regular sinus rhythm. ABDOMEN:  Soft, flat.  Liver, spleen, kidneys are not palpable.  No CVA tenderness. GU: External genitalia is circumcised, meatus adequate.  Testicles are normal. RECTAL:  Normal sphincter tone.  No rectal mass.  Prostate 1.5+, smooth and firm.  IMPRESSION: 1. Distal left ureteral calculus with obstruction. 2. Lower pole caliceal stone, left side.  PLAN:  KUB and after I look at the KUB, then I will discuss what I need to do.  I have discussed this with the patient, he understands.     Ky Barban, M.D.     MIJ/MEDQ  D:  12/11/2011  T:  12/12/2011  Job:  [id number]

## 2011-12-12 NOTE — Anesthesia Procedure Notes (Addendum)
Spinal  Patient location during procedure: OR Start time: 12/12/2011 12:46 PM Staffing CRNA/Resident: Glynn Octave E Preanesthetic Checklist Completed: patient identified, site marked, surgical consent, pre-op evaluation, timeout performed, IV checked, risks and benefits discussed and monitors and equipment checked Spinal Block Patient position: left lateral decubitus Prep: Betadine Patient monitoring: heart rate, cardiac monitor, continuous pulse ox and blood pressure Approach: left paramedian Location: L3-4 Injection technique: single-shot Needle Needle type: Spinocan  Needle gauge: 22 G Needle length: 9 cm Assessment Sensory level: T8 Additional Notes  ATTEMPTS:1 TRAY WU:[id number] TRAY EXPIRATION DATE:09/2012

## 2011-12-12 NOTE — Brief Op Note (Signed)
12/10/2011 - 12/12/2011  2:23 PM  PATIENT:  Anthony Glass  66 y.o. male  PRE-OPERATIVE DIAGNOSIS:  Left ureteral calculus  POST-OPERATIVE DIAGNOSIS:  Left ureteral calculus  PROCEDURE:  Procedure(s) (LRB) with comments: CYSTOSCOPY WITH RETROGRADE PYELOGRAM, URETEROSCOPY AND STENT PLACEMENT (Left) STONE EXTRACTION WITH BASKET (Left) HOLMIUM LASER APPLICATION (Left)  SURGEON:  Surgeon(s) and Role:    * Ky Barban, MD - Primary  PHYSICIAN ASSISTANT:   ASSISTANTS: none   ANESTHESIA:   spinal  EBL:  Total I/O In: 600 [I.V.:600] Out: -   BLOOD ADMINISTERED:none  DRAINS: Penrose drain in the l double j stent no string   LOCAL MEDICATIONS USED:  NONE  SPECIMEN:  Source of Specimen:  l ureteral calculus  DISPOSITION OF SPECIMEN:  PATHOLOGY  COUNTS:  YES  TOURNIQUET:  * No tourniquets in log *  DICTATION: .Other Dictation: Dictation Number dictation [id number]  PLAN OF CARE: Admit to inpatient   PATIENT DISPOSITION:  PACU - hemodynamically stable.   Delay start of Pharmacological VTE agent (>24hrs) due to surgical blood loss or risk of bleeding:

## 2011-12-12 NOTE — Progress Notes (Signed)
Dr. Jerre Simon notified that patient did not receive his Cipro today. MD acknowledged and stated to continue Rocephin as previously ordered.

## 2011-12-12 NOTE — Progress Notes (Signed)
Patient with excessive shivering dr Jerre Simon ordered demerol 12.5 mg

## 2011-12-13 ENCOUNTER — Encounter (HOSPITAL_COMMUNITY): Payer: Self-pay | Admitting: Internal Medicine

## 2011-12-13 DIAGNOSIS — D500 Iron deficiency anemia secondary to blood loss (chronic): Secondary | ICD-10-CM | POA: Diagnosis not present

## 2011-12-13 DIAGNOSIS — N1330 Unspecified hydronephrosis: Secondary | ICD-10-CM | POA: Diagnosis present

## 2011-12-13 HISTORY — DX: Unspecified hydronephrosis: N13.30

## 2011-12-13 LAB — CBC
HCT: 35.1 % — ABNORMAL LOW (ref 39.0–52.0)
MCHC: 34.5 g/dL (ref 30.0–36.0)
MCV: 91.2 fL (ref 78.0–100.0)
RDW: 13.9 % (ref 11.5–15.5)

## 2011-12-13 LAB — BASIC METABOLIC PANEL
BUN: 19 mg/dL (ref 6–23)
Creatinine, Ser: 1.13 mg/dL (ref 0.50–1.35)
GFR calc Af Amer: 76 mL/min — ABNORMAL LOW (ref >90)
GFR calc non Af Amer: 66 mL/min — ABNORMAL LOW (ref >90)

## 2011-12-13 MED ORDER — PANTOPRAZOLE SODIUM 40 MG PO TBEC: 40.0000 mg | DELAYED_RELEASE_TABLET | Freq: Every day | ORAL | Status: DC

## 2011-12-13 MED ORDER — CEFUROXIME AXETIL 500 MG PO TABS: 500.0000 mg | ORAL_TABLET | Freq: Two times a day (BID) | ORAL | Status: DC

## 2011-12-13 MED ORDER — POTASSIUM CHLORIDE IN NACL 20-0.45 MEQ/L-% IV SOLN
INTRAVENOUS | Status: DC
  Administered 2011-12-13: 11:00:00 via INTRAVENOUS
  Filled 2011-12-13 (×3): qty 1000

## 2011-12-13 NOTE — Progress Notes (Signed)
Doing fine afebrile. Voiding satisfactory. May be dc home on pain medicaton and antibiotic. i will see him in office in one week. Some discomfort in lflank most likely 2to stent. hct 35.1 bun and creatinine normal.

## 2011-12-13 NOTE — Progress Notes (Signed)
UR Chart Review Completed  

## 2011-12-13 NOTE — Discharge Summary (Signed)
Physician Discharge Summary  Anthony Glass JXB:[id number] DOB: 1945/11/30 DOA: 12/10/2011  PCP: Provider Not In System (referred to Dr. Felecia Shelling at the time of discharge)  Admit date: 12/10/2011 Discharge date: 12/13/2011  Recommendations for Outpatient Follow-up:  1. The patient was discharged to home in improved condition. He will followup with urologist Dr. Jerre Simon next week as recommended. He has an appointment to followup with his new primary care physician, Dr. Felecia Shelling.  Discharge Diagnoses:  1. Ureterolithiasis. CT of the abdomen revealed a 14.5 mm distal left ureteral obstructing stone and a 12 mm left lower pole 12 mm nonobstructing stone. 2. Markedly left-sided hydronephrosis secondary to renal stone. Status post cystoscopy, left retrograde pyelogram, ureteroscopic stone basket extraction, laser lithotripsy, and insertion of double-J stent on the left, per Dr. Jerre Simon on 12/12/2011. 3. Hematuria secondary to #1 and #2. 4. Urinary tract infection. 5. Heme positive stool. Outpatient referral to gastroenterology recommended non-emergently. 6. Mild blood loss anemia and anemia secondary to hemodilution. 7. Review of systems positive for chest pain (prior to admission). Hemoglobin 12.1 prior to discharge. 8. Grade 1 diastolic dysfunction, right ventricular dilatation, an ejection fraction of 60-65% per echocardiogram on 12/11/2011. 9. Hyponatremia secondary to hypovolemia. Resolved following IV fluid hydration.  Discharge Condition: Improved.  Diet recommendation: Heart healthy.  Filed Weights   12/10/11 1123 12/10/11 1528  Weight: 75.297 kg (166 lb) 75.3 kg (166 lb 0.1 oz)    History of present illness:  The patient is a 66 year old man with no significant prior medical history, who presented to the emergency department on 12/10/2011 with a chief complaint of left flank pain. In the emergency department, he was afebrile and hemodynamically stable. His lab data were significant for a  serum sodium of 132, BUN of 24, glucose of 131, creatinine 1.25, white blood cell count of 11.6, and a urinalysis that revealed a trace of leukocytes, small hemoglobin, and greater than 1.030 specific gravity. CT of his abdomen and pelvis revealed a 14.5 mm left ureteral obstructing stone with associated markedly sided hydronephrosis with perirenal inflammation and a left lower pole 12 mm nonobstructing stone. He was admitted for further evaluation and management.  Hospital Course:  The patient was started on IV fluids for hydration. Analgesics were ordered as needed for pain. Because of the evidence of pararenal inflammation, he was started on Rocephin empirically. Protonix was also started for his history of, what appeared to be acid reflux symptoms/GERD, and for a guaiac positive stool noted per the ED physicians exam. Aspirin was discontinued. His review of systems was also positive for chest pain, not entirely consistent with a GI or cardiac source. The CT of his abdomen did note coronary artery calcifications. A 2-D echocardiogram was ordered. It revealed grade 1 diastolic dysfunction and preserved left ventricular systolic function with an ejection fraction of 60-65%.  Urologist, Dr. Jerre Simon was consulted. Following his examination, he proceeded with cystoscopy, lithotripsy, stone extraction and insertion of a double-J stent on the left. Following the procedure, the patient did have mild hematuria. Also following the procedure, he developed a low-grade fever. Blood cultures were ordered. The results were pending at the time of discharge. He was maintained on Rocephin and IV fluid hydration.  Over the course of the hospitalization, the patient improved clinically and symptomatically. He had no complaints of chest pain or shortness of breath during the hospitalization. He had mild appropriate left flank discomfort which did not require much in the way of analgesia following the urological procedure. His  second stool was guaiac-negative. His hemoglobin fell slightly to 12.1, owing to the dilutional effects of the IV fluids and mild hematuria. He received 4 days of IV antibiotics. He was discharged to home on 7 more days of Ceftin. An appointment with his new primary care physician was scheduled. He was instructed to discuss outpatient evaluation of his chest pain and heme positive stool/GERD and a referral to a cardiologist and gastroenterologist, respectively with his new primary care physician. He voiced understanding.  Procedures: 1. Cystoscopy, lithotripsy, insertion of double-J stent per Dr. Jerre Simon on 12/12/2011. 2. 2-D echocardiogram:- Left ventricle: The cavity size was normal. Wall thickness was normal. Systolic function was normal. The estimated ejection fraction was in the range of 60% to 65%. Wall motion was normal; there were no regional wall motion abnormalities. Doppler parameters are consistent with abnormal left ventricular relaxation (grade 1 diastolic dysfunction). - Mitral valve: Calcified annulus. Trivial regurgitation. - Right ventricle: The cavity size was moderately dilated. Systolic function was mildly reduced. - Tricuspid valve: Trivial regurgitation. - Inferior vena cava: The vessel was mildly dilated; the respirophasic diameter changes were in the normal range (= 50%). - Pericardium, extracardiac: A small pericardial effusion was identified anterior to the heart. Transthoracic echocardiography. M-mode, complete 2D, spectral Doppler, and color Doppler. Height: Height: 185.4cm. Height: 73in. Weight: Weight: 75.3kg. Weight: 165.7lb. Body mass index: BMI: 21.9kg/m^2. Body surface area: BSA: 1.109m^2. Patient status: Inpatient. Location: Bedside.      Consultations:  Alleen Borne, M.D.  Discharge Exam: See exam dictated on the progress note 12/13/2011.  Filed Vitals:   12/13/11 1610 12/13/11 1339 12/13/11 1358 12/13/11 1627  BP: 111/69  116/67   Pulse: 77   92   Temp: 100.9 F (38.3 C) 100 F (37.8 C) 99.1 F (37.3 C) 99.5 F (37.5 C)  TempSrc:    Oral  Resp: 20  18   Height:      Weight:      SpO2: 94%  96%      Discharge Instructions      Discharge Orders    Future Orders Please Complete By Expires   Diet - low sodium heart healthy      Increase activity slowly      Discharge instructions      Comments:   FOLLOWUP WITH YOUR NEW PRIMARY CARE PHYSICIAN. DISCUSS FUTURE APPOINTMENTS FOR YOU TO SEE A CARDIOLOGIST FOR YOUR HISTORY OF CHEST PAIN AND A GASTROENTEROLOGIST FOR YOUR HISTORY OF MICROSCOPIC BLOOD IN HER STOOL. FOLLOWUP WITH DR. Jerre Simon NEXT WEEK. AVOID DEHYDRATION.       Medication List     As of 12/13/2011  6:11 PM    STOP taking these medications         aspirin 325 MG tablet      TAKE these medications         cefUROXime 500 MG tablet   Commonly known as: CEFTIN   Take 1 tablet (500 mg total) by mouth 2 (two) times daily. ANTIBIOTIC      OXYCONTIN PO   Take 1 tablet by mouth once as needed. For pain      pantoprazole 40 MG tablet   Commonly known as: PROTONIX   Take 1 tablet (40 mg total) by mouth daily at 12 noon. FOR PROTECTION OF YOUR STOMACH LINING AND GASTRIC REFLUX.        Follow-up Information    Follow up with [facility], [facility]. On 12/19/2011. (at 2:00 pm. Your new primary care doctor.)  Contact information:   [address] [place] [address] [phone]       Follow up with Alleen Borne I, MD. Schedule an appointment as soon as possible for a visit in 1 week.   Contact information:   [address] [place] [address] [phone]           The results of significant diagnostics from this hospitalization (including imaging, microbiology, ancillary and laboratory) are listed below for reference.    Significant Diagnostic Studies: Ct Abdomen Pelvis Wo Contrast  12/10/2011  *RADIOLOGY REPORT*  Clinical Data: Left flank pain.  CT ABDOMEN AND PELVIS  WITHOUT CONTRAST  Technique:  Multidetector CT imaging of the abdomen and pelvis was performed following the standard protocol without intravenous contrast.  Comparison: 03/20/2009 CT.  Findings: 14.5 mm distal left ureteral obstructing stone located just proximal to the left ureteral vesicle junction.  Marked left- sided hydronephrosis with pararenal inflammation.  Left lower pole 12 mm nonobstructing stone.  Evaluation of solid abdominal viscera is limited by lack of IV contrast.  Taking this limitation into account no focal worrisome hepatic, splenic, pancreatic, renal or adrenal mass.  Coronary artery calcifications.  Atherosclerotic type changes of the aorta with ectasia.  Atherosclerotic type changes iliac arteries with ectasia  Decompressed bladder with markedly thickened wall.  Slight lobulated prostate gland.  Degenerative changes lower lumbar spine without bony destructive lesion.  Small granuloma right lung base with atelectatic changes/scarring.  Limited evaluation of bowel without gross abnormality.  Fluid within the distal esophagus may be related to reflux.  Sludge within the gallbladder without calcified stone.  IMPRESSION: 14.5 mm distal left ureteral obstructing stone located just proximal to the left ureteral vesicle junction.  Marked left-sided hydronephrosis with pararenal inflammation.  Left lower pole 12 mm nonobstructing stone.  Please see above.   Original Report Authenticated By: Fuller Canada, M.D.    Dg Abd 1 View  12/10/2011  *RADIOLOGY REPORT*  Clinical Data: 66 year old male left ureteral calculus.  ABDOMEN - 1 VIEW  Comparison: CT abdomen pelvis 1241 hours the same day.  Findings: Large obstructing distal left ureteral calculus appears grossly stable in position from earlier CT.  Additional large left lower pole calculus re-identified. Nonobstructed bowel gas pattern. Stable visualized osseous structures.  Calcified atherosclerosis.  IMPRESSION: Suspect stable position of the  large obstructing distal left ureteral calculus.   Original Report Authenticated By: Harley Hallmark, M.D.    Dg Retrograde Pyelogram  12/12/2011  *RADIOLOGY REPORT*  Clinical Data: Left ureteral stent.  RETROGRADE PYELOGRAM  Comparison: CT 12/10/2011  Findings: Multiple images are submitted from the left retrograde pyelogram.  These demonstrate opacification of a dilated left renal pelvis and collecting system.  Final images demonstrate placement of left ureteral stent.  IMPRESSION: As above.   Original Report Authenticated By: Cyndie Chime, M.D.     Microbiology: Recent Results (from the past 240 hour(s))  MRSA PCR SCREENING     Status: Normal   Collection Time   12/11/11 11:23 PM      Component Value Range Status Comment   MRSA by PCR NEGATIVE  NEGATIVE Final      Labs: Basic Metabolic Panel:  Lab 12/13/11 5784 12/11/11 0503 12/10/11 1133  NA 136 135 132*  K 4.0 3.9 4.0  CL 103 103 98  CO2 26 25 23   GLUCOSE 119* 116* 131*  BUN 19 19 24*  CREATININE 1.13 1.06 1.25  CALCIUM 8.8 8.9 10.0  MG -- -- --  PHOS -- -- --   Liver Function Tests:  Lab 12/10/11 1740  AST 23  ALT 23  ALKPHOS 72  BILITOT 0.3  PROT 7.0  ALBUMIN 4.0   No results found for this basename: LIPASE:5,AMYLASE:5 in the last 168 hours No results found for this basename: AMMONIA:5 in the last 168 hours CBC:  Lab 12/13/11 0453 12/12/11 2040 12/12/11 0436 12/11/11 0503 12/10/11 1133  WBC 8.3 -- 7.1 9.6 11.6*  NEUTROABS -- -- 5.4 -- 10.7*  HGB 12.1* 12.6* 13.2 13.6 14.9  HCT 35.1* 36.7* 38.5* 39.2 42.5  MCV 91.2 -- 90.8 89.9 90.2  PLT 161 -- 189 223 241   Cardiac Enzymes: No results found for this basename: CKTOTAL:5,CKMB:5,CKMBINDEX:5,TROPONINI:5 in the last 168 hours BNP: BNP (last 3 results) No results found for this basename: PROBNP:3 in the last 8760 hours CBG: No results found for this basename: GLUCAP:5 in the last 168 hours  Time coordinating discharge: greater than 30  minutes  Signed:  Emelynn Rance  [facility] 12/13/2011, 6:11 PM

## 2011-12-13 NOTE — Addendum Note (Signed)
Addendum  created 12/13/11 1029 by Delorise Hunkele J Reda Gettis, CRNA   Modules edited:Notes Section    

## 2011-12-13 NOTE — Progress Notes (Signed)
Patient ambulated in hall with no complaints with staff. Will continue to encourage ambulation.

## 2011-12-13 NOTE — Op Note (Signed)
NAME:  Anthony Glass, Anthony Glass           ACCOUNT NO.:  [id number]  MEDICAL RECORD NO.:  [id number]  LOCATION:  A206                          FACILITY:  [facility]  PHYSICIAN:  Ky Barban, M.D.DATE OF BIRTH:  December 22, 1945  DATE OF PROCEDURE:  12/12/2011 DATE OF DISCHARGE:                              OPERATIVE REPORT   PREOPERATIVE DIAGNOSIS:  Distal left ureteral calculus.  POSTOPERATIVE DIAGNOSIS:  Distal left ureteral calculus.  PROCEDURE:  Cystoscopy, left retrograde pyelogram, ureteroscopic stone basket extraction, holmium laser lithotripsy, insertion of double-J stent size 5-French 24 cm, no string attached.  ANESTHESIA:  Spinal.  PROCEDURE:  The patient under spinal anesthesia in lithotomy position. After usual prep and drape, a #25 cystoscope introduced into the bladder.  He had a previous urethroplasty, that area looks slightly tied, so I had to dilate him to 26-French.  I got #24 cystoscope in without any difficulty.  Prostatic urethra looks wide open.  Bladder was inspected, looks normal.  Left ureteral orifice catheterized with a wedge catheter.  Hypaque was injected.  Dye goes up into the upper ureter.  It was a markedly dilated and tortuous ureter.  There was a filling defect at the ureterovesical junction that is the stone.  Now a guidewire is passed up into the renal pelvis.  Over the guidewire, balloon dilator was introduced into the intramural ureter and it was dilated to 15 size and now the balloon was removed and the cystoscope was removed and guidewire was left in place.  Alongside the guidewire, I introduced a short rigid ureteroscope, went to the level of the stone. Stone was engaged in a basket and then using holmium laser, it was broken into smaller pieces.  Some of the larger pieces came out with the basket.  The other pieces were very fine like sand that washed away and there were still some pieces of the stones, but they are difficult to get them out.  Once  all the stone was out, decided to leave a double-J stent.  A glidewire was passed up into the renal pelvis.  Over the glidewire, 5-French 24 cm double-J stent was positioned between the renal pelvis and the bladder.  Nice loop was obtained in the renal pelvis and the bladder and the guidewire has been removed.  All the instruments were removed.  The patient left the operating room in satisfactory condition.     Ky Barban, M.D.     MIJ/MEDQ  D:  12/12/2011  T:  12/13/2011  Job:  [id number]

## 2011-12-13 NOTE — Anesthesia Postprocedure Evaluation (Signed)
  Anesthesia Post-op Note  Patient: Anthony Glass  Procedure(s) Performed: Procedure(s) (LRB) with comments: CYSTOSCOPY WITH RETROGRADE PYELOGRAM, URETEROSCOPY AND STENT PLACEMENT (Left) STONE EXTRACTION WITH BASKET (Left) HOLMIUM LASER APPLICATION (Left)  Patient Location: room 206  Anesthesia Type: Spinal  Level of Consciousness: awake, alert , oriented and patient cooperative  Airway and Oxygen Therapy: Patient Spontanous Breathing  Post-op Pain: none  Post-op Assessment: Post-op Vital signs reviewed, Patient's Cardiovascular Status Stable, Respiratory Function Stable, Patent Airway, No signs of Nausea or vomiting, Adequate PO intake and Pain level controlled  Post-op Vital Signs: Reviewed and stable  Complications: No apparent anesthesia complications

## 2011-12-13 NOTE — Progress Notes (Signed)
Patient given discharge instructions with no questions. Acknowledged f/u appointments and new medications. Stressed taking all of his antibiotics. Explained to call the doctor if fever is uncontrolled, blood in urine or clots in urine increase, and if any urinary retention occurs. Patient left in stable condition. Taken out of facility by staff via wheelchair.

## 2011-12-13 NOTE — Progress Notes (Signed)
Subjective: The patient has some mild left lower quadrant and left flank discomfort. He had some blood in his urine this morning. He has no difficulty urinating. He denies chest pain. No complaints of cough, painful urination, or diarrhea.  Objective: Vital signs in last 24 hours: Filed Vitals:   12/12/11 1926 12/12/11 2028 12/13/11 0122 12/13/11 0638  BP:  88/61 134/81 111/69  Pulse:  68 94 77  Temp:  98.9 F (37.2 C) 98.5 F (36.9 C) 100.9 F (38.3 C)  TempSrc:      Resp:  18 20 20   Height:      Weight:      SpO2: 93% 90% 96% 94%    Intake/Output Summary (Last 24 hours) at 12/13/11 0932 Last data filed at 12/13/11 0836  Gross per 24 hour  Intake 3096.67 ml  Output    400 ml  Net 2696.67 ml    Weight change:   Physical exam: General: Pleasant 66 year old Caucasian man laying in bed, in no acute distress. Lungs: Decreased breath sounds in the bases, otherwise clear. Heart: S1, S2, with no murmurs rubs or gallops. Abdomen: Positive bowel sounds, soft, mildly tender left flank and left lower quadrant, without rebound distention or guarding. Extremities: No pedal edema.  Lab Results: Basic Metabolic Panel:  Basename 12/13/11 0453 12/11/11 0503  NA 136 135  K 4.0 3.9  CL 103 103  CO2 26 25  GLUCOSE 119* 116*  BUN 19 19  CREATININE 1.13 1.06  CALCIUM 8.8 8.9  MG -- --  PHOS -- --   Liver Function Tests:  Basename 12/10/11 1740  AST 23  ALT 23  ALKPHOS 72  BILITOT 0.3  PROT 7.0  ALBUMIN 4.0   No results found for this basename: LIPASE:2,AMYLASE:2 in the last 72 hours No results found for this basename: AMMONIA:2 in the last 72 hours CBC:  Basename 12/13/11 0453 12/12/11 2040 12/12/11 0436 12/10/11 1133  WBC 8.3 -- 7.1 --  NEUTROABS -- -- 5.4 10.7*  HGB 12.1* 12.6* -- --  HCT 35.1* 36.7* -- --  MCV 91.2 -- 90.8 --  PLT 161 -- 189 --   Cardiac Enzymes: No results found for this basename: CKTOTAL:3,CKMB:3,CKMBINDEX:3,TROPONINI:3 in the last 72  hours BNP: No results found for this basename: PROBNP:3 in the last 72 hours D-Dimer: No results found for this basename: DDIMER:2 in the last 72 hours CBG: No results found for this basename: GLUCAP:6 in the last 72 hours Hemoglobin A1C: No results found for this basename: HGBA1C in the last 72 hours Fasting Lipid Panel: No results found for this basename: CHOL,HDL,LDLCALC,TRIG,CHOLHDL,LDLDIRECT in the last 72 hours Thyroid Function Tests: No results found for this basename: TSH,T4TOTAL,FREET4,T3FREE,THYROIDAB in the last 72 hours Anemia Panel: No results found for this basename: VITAMINB12,FOLATE,FERRITIN,TIBC,IRON,RETICCTPCT in the last 72 hours Coagulation:  Basename 12/10/11 1203  LABPROT 12.4  INR 0.93   Urine Drug Screen: Drugs of Abuse  No results found for this basename: labopia,  cocainscrnur,  labbenz,  amphetmu,  thcu,  labbarb    Alcohol Level: No results found for this basename: ETH:2 in the last 72 hours Urinalysis:  Basename 12/10/11 1140  COLORURINE YELLOW  LABSPEC >1.030*  PHURINE 5.5  GLUCOSEU NEGATIVE  HGBUR SMALL*  BILIRUBINUR NEGATIVE  KETONESUR NEGATIVE  PROTEINUR NEGATIVE  UROBILINOGEN 0.2  NITRITE NEGATIVE  LEUKOCYTESUR TRACE*   Misc. Labs: Echo: Study Conclusions  - Left ventricle: The cavity size was normal. Wall thickness was normal. Systolic function was normal. The estimated ejection fraction  was in the range of 60% to 65%. Wall motion was normal; there were no regional wall motion abnormalities. Doppler parameters are consistent with abnormal left ventricular relaxation (grade 1 diastolic dysfunction). - Mitral valve: Calcified annulus. Trivial regurgitation. - Right ventricle: The cavity size was moderately dilated. Systolic function was mildly reduced. - Tricuspid valve: Trivial regurgitation. - Inferior vena cava: The vessel was mildly dilated; the respirophasic diameter changes were in the normal range (= 50%). -  Pericardium, extracardiac: A small pericardial effusion was identified anterior to the heart. Transthoracic echocardiography. M-mode, complete 2D, spectral Doppler, and color Doppler. Height: Height: 185.4cm. Height: 73in. Weight: Weight: 75.3kg. Weight: 165.7lb. Body mass index: BMI: 21.9kg/m^2. Body surface area: BSA: 1.15m^2. Patient status: Inpatient. Location: Bedside.    Echo:  Micro: Recent Results (from the past 240 hour(s))  MRSA PCR SCREENING     Status: Normal   Collection Time   12/11/11 11:23 PM      Component Value Range Status Comment   MRSA by PCR NEGATIVE  NEGATIVE Final     Studies/Results: Dg Retrograde Pyelogram  12/12/2011  *RADIOLOGY REPORT*  Clinical Data: Left ureteral stent.  RETROGRADE PYELOGRAM  Comparison: CT 12/10/2011  Findings: Multiple images are submitted from the left retrograde pyelogram.  These demonstrate opacification of a dilated left renal pelvis and collecting system.  Final images demonstrate placement of left ureteral stent.  IMPRESSION: As above.   Original Report Authenticated By: Cyndie Chime, M.D.     Medications:  Scheduled:    . acetaminophen  1,000 mg Intravenous Q6H  . cefTRIAXone (ROCEPHIN)  IV  1 g Intravenous Q24H  . meperidine (DEMEROL) injection  12.5 mg Intravenous Once  . meperidine (DEMEROL) injection  12.5 mg Intravenous Once  . ondansetron (ZOFRAN) IV  4 mg Intravenous Once  . pantoprazole  40 mg Oral Q1200  . DISCONTD: ciprofloxacin  200 mg Intravenous Once  . DISCONTD: meperidine (DEMEROL) 1 mg/mL infusion  12.5 mg Intravenous Once   Continuous:    . 0.45 % NaCl with KCl 20 mEq / L    . DISCONTD: dextrose 5 % and 0.45% NaCl 150 mL/hr at 12/13/11 0755  . DISCONTD: lactated ringers     ZOX:[id number] injection, ondansetron (ZOFRAN) IV, ondansetron, DISCONTD: fentaNYL, DISCONTD: iohexol, DISCONTD: midazolam, DISCONTD:  morphine injection, DISCONTD: ondansetron (ZOFRAN) IV, DISCONTD: sodium chloride irrigation,  DISCONTD: sterile water for irrigation  Assessment: Principal Problem:  *Ureterolithiasis Active Problems:  Heme positive stool  Hyponatremia  Diastolic dysfunction  UTI (lower urinary tract infection)  Blood loss anemia   1. Left ureterolithiasis. Status post cystoscopy, pyelogram, uroscopy and stent placement on the left with stone extraction with basket, per Dr. Jerre Simon 12/12/2011. We'll continue supportive treatment.  Postoperative fever. We'll continue Rocephin and monitor accordingly.  Pyuria/hematuria. In this clinical setting, he is being treated for an urinary tract infection. We'll continue Rocephin.  Hyponatremia. Resolved with IV fluid hydration.  Heme positive stool. Second stool was guaiac negative. His hemoglobin has remained stable over the past 24 hours, but did have a decrease from admission due to to hemodilution from IV fluids and from mild hematuria and guaiac positive stool.Marland Kitchen He will need to be evaluated by GI electively, but not emergently. We'll continue Protonix.  Review of systems positive for chest pain. The patient has had no chest pain during the hospitalization. Ejection fraction within normal limits. Echo noted for diastolic dysfunction but no clinical evidence of heart failure.    Plan: 1. Continue current management and  IV fluid hydration. 2. Followup recommendations per Dr. Jerre Simon. 3. If the patient spikes another fever, will check blood cultures. We'll consider expanding antibiotic therapy if he remains febrile of the common febrile again.      LOS: 3 days   Austyn Perriello 12/13/2011, 9:32 AM

## 2011-12-15 ENCOUNTER — Encounter (HOSPITAL_COMMUNITY): Payer: Self-pay | Admitting: Urology

## 2011-12-18 LAB — CULTURE, BLOOD (ROUTINE X 2): Culture: NO GROWTH

## 2011-12-18 LAB — STONE ANALYSIS

## 2011-12-25 ENCOUNTER — Encounter (HOSPITAL_COMMUNITY): Payer: Self-pay

## 2011-12-28 ENCOUNTER — Encounter (HOSPITAL_COMMUNITY)
Admission: RE | Admit: 2011-12-28 | Discharge: 2011-12-28 | Disposition: A | Discharge status: Home / Self Care | Payer: Medicare Other | Source: Ambulatory Visit | Attending: Urology | Admitting: Urology

## 2011-12-28 ENCOUNTER — Encounter (HOSPITAL_COMMUNITY): Payer: Self-pay

## 2011-12-28 NOTE — Patient Instructions (Addendum)
20 Mahesh Sizemore  12/28/2011   Your procedure is scheduled on:  01/03/2012  Report to [facility] at  1130  AM.  Call this number if you have problems the morning of surgery: [phone]   Remember:   Do not eat food:After Midnight.  May have clear liquids:until Midnight .    Take these medicines the morning of surgery with A SIP OF WATER:  none   Do not wear jewelry, make-up or nail polish.  Do not wear lotions, powders, or perfumes. You may wear deodorant.  Do not shave 48 hours prior to surgery. Men may shave face and neck.  Do not bring valuables to the hospital.  Contacts, dentures or bridgework may not be worn into surgery.  Leave suitcase in the car. After surgery it may be brought to your room.  For patients admitted to the hospital, checkout time is 11:00 AM the day of discharge.   Patients discharged the day of surgery will not be allowed to drive home.  Name and phone number of your driver: family  Special Instructions: N/A   Please read over the following fact sheets that you were given: Pain Booklet, Surgical Site Infection Prevention, Anesthesia Post-op Instructions and Care and Recovery After Surgery Lithotripsy for Kidney Stones WHAT ARE KIDNEY STONES? The kidneys filter blood for chemicals the body cannot use. These waste chemicals are eliminated in the urine. They are removed from the body. Under some conditions, these chemicals may become concentrated. When this happens, they form crystals in the urine. When these crystals build up and stick together, stones may form. When these stones block the flow of urine through the urinary tract, they may cause severe pain. The urinary tract is very sensitive to blockage and stretching by the stone. WHAT IS LITHOTRIPSY? Lithotripsy is a treatment that can sometimes help eliminate kidney stones and pain faster. A form of lithotripsy, also known as ESWL (extracorporeal shock wave lithotripsy), is a nonsurgical procedure  that helps your body rid itself of the kidney stone with a minimum amount of pain. EWSL is a method of crushing a kidney stone with shock waves. These shock waves pass through your body. They cause the kidney stones to crumble while still in the urinary tract. It is then easier for the smaller pieces of stone to pass in the urine. Lithotripsy usually takes about an hour. It is done in a hospital, a lithotripsy center, or a mobile unit. It usually does not require an overnight stay. Your caregiver will instruct you on preparation for the procedure. Your caregiver will tell you what to expect afterward. LET YOUR CAREGIVER KNOW ABOUT:  Allergies.  Medicines taken including herbs, eye drops, over the counter medicines (including aspirin, aleve, or motrin for treatment of inflammatory conditions) and creams.  Use of steroids (by mouth or creams).  Previous problems with anesthetics or novocaine.  Possibility of pregnancy, if this applies.  History of blood clots (thrombophlebitis).  History of bleeding or blood problems.  Previous surgery.  Other health problems. RISKS AND COMPLICATIONS Complications of lithotripsy are uncommon, but include the following:  Infection.  Bleeding of the kidney.  Bruising of the kidney or skin.  Obstruction of the ureter (the passageway from the kidney to the bladder).  Failure of the stone to fragment (break apart). PROCEDURE A stent (flexible tube with holes) may be placed in your ureter. The ureter is the tube that transports the urine from the kidneys to the bladder.  Your caregiver may place a stent before the procedure. This will help keep urine flowing from the kidney if the fragments of the stone block the ureter. You may receive an intravenous (IV) line to give you fluids and medicines. These medicines may help you relax or make you sleep. During the procedure, you will lie comfortably on a fluid-filled cushion or in a warm-water bath. After an x-ray  or ultrasound locates your stone, shock waves are aimed at the stone. If you are awake, you may feel a tapping sensation (feeling) as the shock waves pass through your body. If large stone particles remain after treatment, a second procedure may be necessary at a later date. For comfort during the test:  Relax as much as possible.  Try to remain still as much as possible.  Try to follow instructions to speed up the test.  Let your caregiver know if you are uncomfortable, anxious, or in pain. AFTER THE PROCEDURE  After surgery, you will be taken to the recovery area. A nurse will watch and check your progress. Once you're awake, stable, and taking fluids well, you will be allowed to go home as long as there are no problems. You may be prescribed antibiotics (medicines that kill germs) to help prevent infection. You may also be prescribed pain medicine if needed. In a week or two, your doctor may remove your stent, if you have one. Your caregiver will check to see whether or not stone particles remain. PASSING THE STONE It may take anywhere from a day to several weeks for the stone particles to leave your body. During this time, drink at least 8 to 12 eight ounce glasses of water every day. It is normal for your urine to be cloudy or slightly bloody for a few weeks following this procedure. You may even see small pieces of stone in your urine. A slight fever and some pain are also normal. Your caregiver may ask you to strain your urine to collect some stone particles for chemical analysis. If you find particles while straining the urine, save them. Analysis tells you and the caregiver what the stone is made of. Knowing this may help prevent future stones. PREVENTING FUTURE STONES  Drink about 8 to 12, eight-ounce glasses of water every day.  Follow the diet your caregiver recommends.  Take your prescribed medicine.  See your caregiver regularly for checkups. SEEK IMMEDIATE MEDICAL CARE  IF:  You develop an oral temperature above 102 F (38.9 C), or as your caregiver suggests.  Your pain is not relieved by medicine.  You develop nausea (feeling sick to your stomach) and vomiting.  You develop heavy bleeding.  You have difficulty urinating. Document Released: 03/03/2000 Document Revised: 05/29/2011 Document Reviewed: 12/27/2007 [facility] Patient Information 2013 [place], [place]. PATIENT INSTRUCTIONS POST-ANESTHESIA  IMMEDIATELY FOLLOWING SURGERY:  Do not drive or operate machinery for the first twenty four hours after surgery.  Do not make any important decisions for twenty four hours after surgery or while taking narcotic pain medications or sedatives.  If you develop intractable nausea and vomiting or a severe headache please notify your doctor immediately.  FOLLOW-UP:  Please make an appointment with your surgeon as instructed. You do not need to follow up with anesthesia unless specifically instructed to do so.  WOUND CARE INSTRUCTIONS (if applicable):  Keep a dry clean dressing on the anesthesia/puncture wound site if there is drainage.  Once the wound has quit draining you may leave it open to air.  Generally  you should leave the bandage intact for twenty four hours unless there is drainage.  If the epidural site drains for more than 36-48 hours please call the anesthesia department.  QUESTIONS?:  Please feel free to call your physician or the hospital operator if you have any questions, and they will be happy to assist you.

## 2012-01-03 ENCOUNTER — Ambulatory Visit (HOSPITAL_COMMUNITY)
Admission: RE | Admit: 2012-01-03 | Discharge: 2012-01-03 | Disposition: A | Discharge status: Home / Self Care | Payer: Medicare Other | Source: Ambulatory Visit | Attending: Urology | Admitting: Urology

## 2012-01-03 ENCOUNTER — Encounter (HOSPITAL_COMMUNITY)
Admission: RE | Disposition: A | Discharge status: Home / Self Care | Payer: Self-pay | Source: Ambulatory Visit | Attending: Urology

## 2012-01-03 ENCOUNTER — Ambulatory Visit (HOSPITAL_COMMUNITY): Payer: Medicare Other

## 2012-01-03 ENCOUNTER — Encounter (HOSPITAL_COMMUNITY): Payer: Self-pay | Admitting: *Deleted

## 2012-01-03 DIAGNOSIS — N200 Calculus of kidney: Secondary | ICD-10-CM | POA: Insufficient documentation

## 2012-01-03 HISTORY — PX: EXTRACORPOREAL SHOCK WAVE LITHOTRIPSY: SHX1557

## 2012-01-03 SURGERY — LITHOTRIPSY, ESWL
Anesthesia: Moderate Sedation | Laterality: Left

## 2012-01-03 MED ORDER — DIPHENHYDRAMINE HCL 25 MG PO CAPS
25.0000 mg | ORAL_CAPSULE | Freq: Once | ORAL | Status: AC
  Administered 2012-01-03: 25 mg via ORAL

## 2012-01-03 MED ORDER — DIPHENHYDRAMINE HCL 25 MG PO CAPS
ORAL_CAPSULE | ORAL | Status: AC
  Filled 2012-01-03: qty 1

## 2012-01-03 MED ORDER — SODIUM CHLORIDE 0.45 % IV SOLN
INTRAVENOUS | Status: DC
  Administered 2012-01-03: 12:00:00 via INTRAVENOUS

## 2012-01-03 MED ORDER — DIAZEPAM 5 MG PO TABS
ORAL_TABLET | ORAL | Status: AC
  Filled 2012-01-03: qty 2

## 2012-01-03 MED ORDER — DIAZEPAM 5 MG PO TABS
10.0000 mg | ORAL_TABLET | Freq: Once | ORAL | Status: AC
  Administered 2012-01-03: 10 mg via ORAL

## 2012-01-03 SURGICAL SUPPLY — 3 items
CLOTH BEACON ORANGE TIMEOUT ST (SAFETY) IMPLANT
GOWN STRL REIN XL XLG (GOWN DISPOSABLE) IMPLANT
TOWEL OR 17X26 4PK STRL BLUE (TOWEL DISPOSABLE) IMPLANT

## 2012-01-03 NOTE — Progress Notes (Signed)
No change in H&P on reexamination. 

## 2012-01-03 NOTE — H&P (Signed)
NAME:  ROSS, HAMBELTON NO.:  [id number]  MEDICAL RECORD NO.:  [id number]  LOCATION:  [facility]                          FACILITY:  [facility]  PHYSICIAN:  Ky Barban, M.D.DATE OF BIRTH:  1945-11-11  DATE OF ADMISSION:  01/03/2012 DATE OF DISCHARGE:  LH                             HISTORY & PHYSICAL   Mr. Shimanek is a 66 year old gentleman.  He was recently in [facility] where the left renal colic was found to have stone in the distal left ureter and I have removed that stone with a basket and left a double-J stent because he also has a large 9 mm stone in the left upper ureter, and he is being brought as outpatient to undergo ESL for the left renal calculus.  I have discussed with them procedure limitation, complications.  He understands and no guarantees about the results.  He want me to go ahead and proceed.  PAST MEDICAL HISTORY:  TUR prostate for BPH April 2012 and litholapaxy. No history of diabetes or hypertension.  PERSONAL HISTORY:  Does not smoke or drink.  REVIEW OF SYSTEMS:  Unremarkable.  PHYSICAL EXAMINATION:  GENERAL:  Moderately built male, not in acute distress, fully conscious, alert, oriented. CENTRAL NERVOUS SYSTEM:  No gross neurological deficit. HEAD, NECK, ENT:  Negative. CHEST:  Symmetrical.  No breath sounds. HEART:  Regular sinus rhythm.  No murmur. ABDOMEN:  Soft, flat.  Liver, spleen, kidneys not palpable.  No CVA tenderness. GU:  External genitalia is circumcised.  Meatus adequate.  Testicles are normal. RECTAL:  Deferred. EXTREMITIES:  Normal.  IMPRESSION:  History of left ureteral calculus, Double-J stent, left renal calculus.  PLAN:  ESL, left renal calculus as outpatient.     Ky Barban, M.D.     MIJ/MEDQ  D:  01/03/2012  T:  01/03/2012  Job:  [id number]

## 2012-01-04 ENCOUNTER — Encounter (HOSPITAL_COMMUNITY): Payer: Self-pay | Admitting: Urology

## 2012-01-10 ENCOUNTER — Other Ambulatory Visit (HOSPITAL_COMMUNITY): Payer: Self-pay | Admitting: Urology

## 2012-01-10 ENCOUNTER — Ambulatory Visit (HOSPITAL_COMMUNITY)
Admission: RE | Admit: 2012-01-10 | Discharge: 2012-01-10 | Disposition: A | Discharge status: Home / Self Care | Payer: Medicare Other | Source: Ambulatory Visit | Attending: Urology | Admitting: Urology

## 2012-01-10 DIAGNOSIS — N200 Calculus of kidney: Secondary | ICD-10-CM | POA: Insufficient documentation

## 2012-01-10 DIAGNOSIS — Z09 Encounter for follow-up examination after completed treatment for conditions other than malignant neoplasm: Secondary | ICD-10-CM | POA: Insufficient documentation

## 2012-01-10 DIAGNOSIS — R109 Unspecified abdominal pain: Secondary | ICD-10-CM | POA: Insufficient documentation

## 2012-01-10 DIAGNOSIS — N201 Calculus of ureter: Secondary | ICD-10-CM | POA: Insufficient documentation

## 2012-01-12 ENCOUNTER — Ambulatory Visit (HOSPITAL_COMMUNITY)
Admission: RE | Admit: 2012-01-12 | Discharge: 2012-01-12 | Disposition: A | Discharge status: Home / Self Care | Payer: Medicare Other | Source: Ambulatory Visit | Attending: Urology | Admitting: Urology

## 2012-01-12 DIAGNOSIS — M4120 Other idiopathic scoliosis, site unspecified: Secondary | ICD-10-CM | POA: Insufficient documentation

## 2012-01-12 DIAGNOSIS — N200 Calculus of kidney: Secondary | ICD-10-CM | POA: Insufficient documentation

## 2012-01-12 DIAGNOSIS — N201 Calculus of ureter: Secondary | ICD-10-CM | POA: Insufficient documentation

## 2012-01-23 ENCOUNTER — Encounter (HOSPITAL_COMMUNITY): Payer: Self-pay | Admitting: Pharmacy Technician

## 2012-01-23 NOTE — Patient Instructions (Addendum)
20 Quentavious Rittenhouse  01/23/2012   Your procedure is scheduled on:  01/30/2012   Report to [facility] at  930  AM.  Call this number if you have problems the morning of surgery: [phone]   Remember:   Do not eat food:After Midnight.  May have clear liquids:until Midnight .    Take these medicines the morning of surgery with A SIP OF WATER:  none   Do not wear jewelry, make-up or nail polish.  Do not wear lotions, powders, or perfumes.   Do not shave 48 hours prior to surgery. Men may shave face and neck.  Do not bring valuables to the hospital.  Contacts, dentures or bridgework may not be worn into surgery.  Leave suitcase in the car. After surgery it may be brought to your room.  For patients admitted to the hospital, checkout time is 11:00 AM the day of discharge.   Patients discharged the day of surgery will not be allowed to drive home.  Name and phone number of your driver: family  Special Instructions: Shower using CHG 2 nights before surgery and the night before surgery.  If you shower the day of surgery use CHG.  Use special wash - you have one bottle of CHG for all showers.  You should use approximately 1/3 of the bottle for each shower.   Please read over the following fact sheets that you were given: Pain Booklet, Coughing and Deep Breathing, MRSA Information, Surgical Site Infection Prevention, Anesthesia Post-op Instructions and Care and Recovery After Surgery Cystoscopy (Bladder Exam) A cystoscopy is an examination of your urinary bladder with a cystoscope. A cystoscope is an instrument like a small telescope with strong lights and lenses. It is inserted into the bladder through the urethra (the opening into the bladder) and allows your caregiver to examine the inside of your bladder. The procedure causes little discomfort and can be done in a hospital or office. It is a diagnostic procedure to evaluate the inside of your bladder. It may involve x-rays to further  evaluate the ureters or internal aspects of the kidneys. It may aid in the removal of urinary stones  or in taking tissue samples (biopsies) if necessary. The procedure is easier in females because of a shorter urethra. In a male, the procedure must be done through the penis. This often requires more sedation and more time to do the procedure. The procedure usually takes twenty minutes to one half hour for a male and approximately an hour for a male. LET YOUR CAREGIVERS KNOW ABOUT:  Allergies.  Medications taken including herbs, eye drops, over the counter medications, and creams.  Use of steroids (by mouth or creams).  Previous problems with anesthetics or novocaine.  Possibility of pregnancy, if this applies.  History of blood clots (thrombophlebitis).  History of bleeding or blood problems.  Previous surgery, especially where prosthetics have been used like hip or knee replacements, and heart valve replacements.  Other health problems. BEFORE THE PROCEDURE  You should be present 60 minutes prior to your procedure or as directed.  PROCEDURE During the procedure, you will:  Be assisted by your urologist and a nurse.  Lie on a cystoscopy table with your knees elevated and legs apart and covered with a drape. For women this is the same position as when a pap smear is taken.  Have the urethral area or penis washed and covered with sterile towels.  Have an anesthetic (numbing) jelly applied  to the urethra. This is usually all that is required for females but males may also require sedation.  Have the cystoscope inserted through the urethra and into the bladder. Sterile fluid will flow through the cystoscope and into the bladder. This will expand the bladder and provide clear fluid for the urologist to look through and examine the interior of the bladder.  Be allowed to go home once you are doing well, are stable, and awake if you were given a sedative. If given a sedative, have  someone give you a ride home. AFTER THE PROCEDURE  You may have temporary bleeding and burning on urination.  Drink lots of fluids. SEEK IMMEDIATE MEDICAL CARE IF:  There is an increase in blood in the urine or if you are passing clots.  You have difficulty in passing your urine  You develop chills and/or an unexplained oral temperature above 102 F (38.9 C). Your caregiver will discuss your results with you following the procedure. This may be at a later time if you have been sedated. If other testing or biopsies were taken, ask your caregiver how you are to obtain the results. Remember it is your responsibility to get your results. Do not assume everything is normal if you do not hear from your caregiver. Document Released: 03/03/2000 Document Revised: 05/29/2011 Document Reviewed: 08/28/2011 [facility] Patient Information 2013 [place], [place]. PATIENT INSTRUCTIONS POST-ANESTHESIA  IMMEDIATELY FOLLOWING SURGERY:  Do not drive or operate machinery for the first twenty four hours after surgery.  Do not make any important decisions for twenty four hours after surgery or while taking narcotic pain medications or sedatives.  If you develop intractable nausea and vomiting or a severe headache please notify your doctor immediately.  FOLLOW-UP:  Please make an appointment with your surgeon as instructed. You do not need to follow up with anesthesia unless specifically instructed to do so.  WOUND CARE INSTRUCTIONS (if applicable):  Keep a dry clean dressing on the anesthesia/puncture wound site if there is drainage.  Once the wound has quit draining you may leave it open to air.  Generally you should leave the bandage intact for twenty four hours unless there is drainage.  If the epidural site drains for more than 36-48 hours please call the anesthesia department.  QUESTIONS?:  Please feel free to call your physician or the hospital operator if you have any questions, and they will be happy to  assist you.

## 2012-01-24 ENCOUNTER — Encounter (HOSPITAL_COMMUNITY): Payer: Self-pay

## 2012-01-24 ENCOUNTER — Inpatient Hospital Stay (HOSPITAL_COMMUNITY): Admission: RE | Admit: 2012-01-24 | Discharge: 2012-01-24 | Payer: Medicare Other | Source: Ambulatory Visit

## 2012-01-24 NOTE — Progress Notes (Signed)
Pt had labwork on 9/25 and Dr. Jayme Cloud was notified of them and said it was okay to use them for pt's upcoming surgery on 01/30/12.

## 2012-01-30 ENCOUNTER — Ambulatory Visit (HOSPITAL_COMMUNITY): Payer: Medicare Other

## 2012-01-30 ENCOUNTER — Encounter (HOSPITAL_COMMUNITY): Payer: Self-pay | Admitting: Anesthesiology

## 2012-01-30 ENCOUNTER — Ambulatory Visit (HOSPITAL_COMMUNITY)
Admission: RE | Admit: 2012-01-30 | Discharge: 2012-01-30 | Disposition: A | Discharge status: Home / Self Care | Payer: Medicare Other | Source: Ambulatory Visit | Attending: Urology | Admitting: Urology

## 2012-01-30 ENCOUNTER — Ambulatory Visit (HOSPITAL_COMMUNITY): Payer: Medicare Other | Admitting: Anesthesiology

## 2012-01-30 ENCOUNTER — Encounter (HOSPITAL_COMMUNITY): Payer: Self-pay | Admitting: *Deleted

## 2012-01-30 ENCOUNTER — Encounter (HOSPITAL_COMMUNITY)
Admission: RE | Disposition: A | Discharge status: Home / Self Care | Payer: Self-pay | Source: Ambulatory Visit | Attending: Urology

## 2012-01-30 DIAGNOSIS — N201 Calculus of ureter: Secondary | ICD-10-CM | POA: Insufficient documentation

## 2012-01-30 HISTORY — PX: STONE EXTRACTION WITH BASKET: SHX5318

## 2012-01-30 HISTORY — PX: CYSTOSCOPY W/ URETERAL STENT PLACEMENT: SHX1429

## 2012-01-30 SURGERY — CYSTOSCOPY, WITH RETROGRADE PYELOGRAM AND URETERAL STENT INSERTION
Anesthesia: Spinal | Site: Bladder | Laterality: Left | Wound class: Clean Contaminated

## 2012-01-30 MED ORDER — ONDANSETRON HCL 4 MG/2ML IJ SOLN: 4.0000 mg | Freq: Once | INTRAMUSCULAR | Status: DC | PRN

## 2012-01-30 MED ORDER — FENTANYL CITRATE 0.05 MG/ML IJ SOLN
INTRAMUSCULAR | Status: DC | PRN
  Administered 2012-01-30: 12.5 ug via INTRAVENOUS

## 2012-01-30 MED ORDER — LIDOCAINE HCL (PF) 1 % IJ SOLN
INTRAMUSCULAR | Status: AC
  Filled 2012-01-30: qty 5

## 2012-01-30 MED ORDER — LACTATED RINGERS IV SOLN
INTRAVENOUS | Status: DC
  Administered 2012-01-30 (×2): via INTRAVENOUS

## 2012-01-30 MED ORDER — LACTATED RINGERS IV SOLN: Freq: Once | INTRAVENOUS | Status: DC

## 2012-01-30 MED ORDER — SODIUM CHLORIDE 0.9 % IR SOLN
Status: DC | PRN
  Administered 2012-01-30 (×2): 3000 mL

## 2012-01-30 MED ORDER — FENTANYL CITRATE 0.05 MG/ML IJ SOLN: 25.0000 ug | INTRAMUSCULAR | Status: DC | PRN

## 2012-01-30 MED ORDER — LIDOCAINE IN DEXTROSE 5-7.5 % IV SOLN
INTRAVENOUS | Status: DC | PRN
  Administered 2012-01-30: 75 mg via INTRATHECAL

## 2012-01-30 MED ORDER — FENTANYL CITRATE 0.05 MG/ML IJ SOLN
INTRAMUSCULAR | Status: DC | PRN
  Administered 2012-01-30: 25 ug via INTRAVENOUS
  Administered 2012-01-30: 12.5 ug via INTRAVENOUS
  Administered 2012-01-30: 25 ug via INTRAVENOUS

## 2012-01-30 MED ORDER — MIDAZOLAM HCL 2 MG/2ML IJ SOLN
INTRAMUSCULAR | Status: AC
  Filled 2012-01-30: qty 2

## 2012-01-30 MED ORDER — IOHEXOL 350 MG/ML SOLN
INTRAVENOUS | Status: DC | PRN
  Administered 2012-01-30: 50 mL

## 2012-01-30 MED ORDER — PROPOFOL INFUSION 10 MG/ML OPTIME
INTRAVENOUS | Status: DC | PRN
  Administered 2012-01-30: 100 ug/kg/min via INTRAVENOUS

## 2012-01-30 MED ORDER — FENTANYL CITRATE 0.05 MG/ML IJ SOLN
INTRAMUSCULAR | Status: AC
  Filled 2012-01-30: qty 2

## 2012-01-30 MED ORDER — MIDAZOLAM HCL 2 MG/2ML IJ SOLN
1.0000 mg | INTRAMUSCULAR | Status: DC | PRN
  Administered 2012-01-30 (×2): 2 mg via INTRAVENOUS

## 2012-01-30 MED ORDER — PROPOFOL 10 MG/ML IV EMUL
INTRAVENOUS | Status: AC
  Filled 2012-01-30: qty 20

## 2012-01-30 MED ORDER — STERILE WATER FOR IRRIGATION IR SOLN
Status: DC | PRN
  Administered 2012-01-30: 1000 mL

## 2012-01-30 SURGICAL SUPPLY — 24 items
BAG DRAIN URO TABLE W/ADPT NS (DRAPE) ×3 IMPLANT
BAG DRN 8 ADPR NS SKTRN CSTL (DRAPE) ×2
CATH 5 FR WEDGE TIP (UROLOGICAL SUPPLIES) ×3 IMPLANT
CATH OPEN TIP 5FR (CATHETERS) ×3 IMPLANT
CLOTH BEACON ORANGE TIMEOUT ST (SAFETY) ×3 IMPLANT
DILATOR UROMAX ULTRA (MISCELLANEOUS) ×2 IMPLANT
GLOVE BIO SURGEON STRL SZ7 (GLOVE) ×3 IMPLANT
GLOVE BIOGEL PI IND STRL 7.0 (GLOVE) ×2 IMPLANT
GLOVE BIOGEL PI INDICATOR 7.0 (GLOVE) ×2
GLOVE EXAM NITRILE MD LF STRL (GLOVE) ×4 IMPLANT
GLOVE OPTIFIT SS 6.5 STRL BRWN (GLOVE) ×4 IMPLANT
GOWN STRL REIN XL XLG (GOWN DISPOSABLE) ×3 IMPLANT
IV NS IRRIG 3000ML ARTHROMATIC (IV SOLUTION) ×6 IMPLANT
KIT ROOM TURNOVER AP CYSTO (KITS) ×3 IMPLANT
LASER FIBER DISP (UROLOGICAL SUPPLIES) IMPLANT
LASER FIBER DISP 1000U (UROLOGICAL SUPPLIES) IMPLANT
MANIFOLD NEPTUNE II (INSTRUMENTS) ×3 IMPLANT
PACK CYSTO (CUSTOM PROCEDURE TRAY) ×3 IMPLANT
PAD ARMBOARD 7.5X6 YLW CONV (MISCELLANEOUS) ×3 IMPLANT
STENT PERCUFLEX 4.8FRX24 (STENTS) ×2 IMPLANT
STONE RETRIEVAL GEMINI 2.4 FR (MISCELLANEOUS) ×2 IMPLANT
TOWEL OR 17X26 4PK STRL BLUE (TOWEL DISPOSABLE) ×3 IMPLANT
WATER STERILE IRR 1000ML POUR (IV SOLUTION) ×2 IMPLANT
WIRE GUIDE BENTSON .035 15CM (WIRE) ×3 IMPLANT

## 2012-01-30 NOTE — Addendum Note (Signed)
Addendum  created 01/30/12 1346 by Franco Nones, CRNA   Modules edited:Charges VN

## 2012-01-30 NOTE — Anesthesia Postprocedure Evaluation (Signed)
Anesthesia Post Note  Patient: Anthony Glass  Procedure(s) Performed: Procedure(s) (LRB): CYSTOSCOPY WITH RETROGRADE PYELOGRAM/URETERAL STENT PLACEMENT (Left) STONE EXTRACTION WITH BASKET (Left) HOLMIUM LASER APPLICATION (Left)  Anesthesia type: Spinal  Patient location: PACU  Post pain: Pain level controlled  Post assessment: Post-op Vital signs reviewed, Patient's Cardiovascular Status Stable, Respiratory Function Stable, Patent Airway, No signs of Nausea or vomiting and Pain level controlled  Last Vitals:  Filed Vitals:   01/30/12 1332  BP: 100/64  Pulse: 67  Temp: 36.5 C  Resp: 10    Post vital signs: Reviewed and stable  Level of consciousness: awake and alert   Complications: No apparent anesthesia complications

## 2012-01-30 NOTE — Progress Notes (Signed)
No change in H&P on reexamination. 

## 2012-01-30 NOTE — Anesthesia Preprocedure Evaluation (Addendum)
Anesthesia Evaluation  Patient identified by MRN, date of birth, ID band Patient awake    Reviewed: Allergy & Precautions, H&P , NPO status , Patient's Chart, lab work & pertinent test results  History of Anesthesia Complications Negative for: history of anesthetic complications  Airway Mallampati: II      Dental  (+) Teeth Intact   Pulmonary neg pulmonary ROS,  breath sounds clear to auscultation        Cardiovascular negative cardio ROS  Rhythm:Regular     Neuro/Psych    GI/Hepatic negative GI ROS,   Endo/Other    Renal/GU Renal disease     Musculoskeletal   Abdominal   Peds  Hematology   Anesthesia Other Findings   Reproductive/Obstetrics                           Anesthesia Physical Anesthesia Plan  ASA: II  Anesthesia Plan: Spinal   Post-op Pain Management:    Induction:   Airway Management Planned: Nasal Cannula  Additional Equipment:   Intra-op Plan:   Post-operative Plan:   Informed Consent: I have reviewed the patients History and Physical, chart, labs and discussed the procedure including the risks, benefits and alternatives for the proposed anesthesia with the patient or authorized representative who has indicated his/her understanding and acceptance.     Plan Discussed with:   Anesthesia Plan Comments:         Anesthesia Quick Evaluation  

## 2012-01-30 NOTE — Transfer of Care (Signed)
Immediate Anesthesia Transfer of Care Note  Patient: Anthony Glass  Procedure(s) Performed: Procedure(s) (LRB): CYSTOSCOPY WITH RETROGRADE PYELOGRAM/URETERAL STENT PLACEMENT (Left) STONE EXTRACTION WITH BASKET (Left) HOLMIUM LASER APPLICATION (Left)  Patient Location: PACU  Anesthesia Type: SAB  Level of Consciousness: awake  Airway & Oxygen Therapy: Patient Spontanous Breathing and non-rebreather face mask  Post-op Assessment: Report given to PACU RN, Post -op Vital signs reviewed and stable. SAB Level  T 12  Post vital signs: Reviewed and stable  Complications: No apparent anesthesia complications

## 2012-01-30 NOTE — Anesthesia Procedure Notes (Signed)
Procedure Name: MAC Date/Time: 01/30/2012 12:38 PM Performed by: Franco Nones Pre-anesthesia Checklist: Patient identified, Emergency Drugs available, Suction available, Timeout performed and Patient being monitored Patient Re-evaluated:Patient Re-evaluated prior to inductionOxygen Delivery Method: Nasal Cannula    Spinal  Patient location during procedure: OR Start time: 01/30/2012 12:42 PM End time: 01/30/2012 12:47 PM Staffing CRNA/Resident: Minerva Areola S Preanesthetic Checklist Completed: patient identified, site marked, surgical consent, pre-op evaluation, timeout performed, IV checked, risks and benefits discussed and monitors and equipment checked Spinal Block Patient position: left lateral decubitus Prep: Betadine Patient monitoring: heart rate, cardiac monitor, continuous pulse ox and blood pressure Approach: left paramedian Location: L3-4 Injection technique: single-shot Needle Needle type: Spinocan  Needle gauge: 22 G Needle length: 9 cm Assessment Sensory level: T8 (level  at 1256) Additional Notes Betadine prep x3 1% lidocaine skinwheal  Clear CSF pre and post injection  ATTEMPTS: 1  TRAY [id number] TRAY EXPIRATION DATE: 2014-07

## 2012-01-30 NOTE — H&P (Signed)
NAME:  Anthony Glass, Anthony Glass           ACCOUNT NO.:  [id number]  MEDICAL RECORD NO.:  [id number]  LOCATION:                                 FACILITY:  PHYSICIAN:  Ky Barban, M.D.DATE OF BIRTH:  Dec 15, 1945  DATE OF ADMISSION:  01/30/2012 DATE OF DISCHARGE:  LH                             HISTORY & PHYSICAL   CHIEF COMPLAINT:  Distal left ureteral calculus.  HISTORY:  A 66 year old gentleman had large stone in the left kidney underwent ESL, and now there is a stone in the distal left ureter which is still 7-8 mm in size.  Had  a double-J stent on that side, so I am going to bring him to do a stone basket, get the stone out under anesthesia, leave the double-J stent.  I have explained this to the patient.  I have discussed the procedure of stone basket.  He understands and want me to go ahead and proceed.  PAST MEDICAL HISTORY:  He had a TUR prostate for BPH April 2012 and also had litholapaxy.  No history of diabetes or hypertension.  PERSONAL HISTORY:  Does not smoke or drink.  REVIEW OF SYSTEMS:  Unremarkable.  No chest pain, orthopnea, PND, nausea, vomiting, fever, or chills.  PHYSICAL EXAMINATION:  GENERAL:  Moderately built male, not in acute distress, fully conscious, alert, oriented. CENTRAL NERVOUS SYSTEM:  No gross neurological deficit. HEAD, NECK, EYE, ENT:  Negative. CHEST:  Symmetrical.  Normal breath sounds. HEART:  Regular sinus rhythm.  No murmur. ABDOMEN:  Soft, flat.  Liver, spleen, kidneys not palpable.  No CVA tenderness. GU: External genitalia is circumcised.  Meatus adequate.  Testicles are normal. RECTAL:  Deferred. EXTREMITIES:  Normal.  IMPRESSION:  Distal left ureteral calculus.  PLAN:  Cystoscopy, left retrograde pyelogram, ureteroscopic stone basket, UBE holmium laser, insertion of double-J stent under anesthesia as outpatient.     Ky Barban, M.D.     MIJ/MEDQ  D:  01/29/2012  T:  01/29/2012  Job:  [id number]

## 2012-01-30 NOTE — Brief Op Note (Signed)
01/30/2012  1:24 PM  PATIENT:  Anthony Glass  66 y.o. male  PRE-OPERATIVE DIAGNOSIS:  left distal ureteral calculus  POST-OPERATIVE DIAGNOSIS:  * No post-op diagnosis entered *  PROCEDURE:  Procedure(s) (LRB) with comments: CYSTOSCOPY WITH RETROGRADE PYELOGRAM/URETERAL STENT PLACEMENT (Left) STONE EXTRACTION WITH BASKET (Left) HOLMIUM LASER APPLICATION (Left)  SURGEON:  Surgeon(s) and Role:    * Ky Barban, MD - Primary  PHYSICIAN ASSISTANT:   ASSISTANTS: none   ANESTHESIA:   spinal  EBL:  Total I/O In: 1200 [I.V.:1200] Out: 0   BLOOD ADMINISTERED:none  DRAINS:    LOCAL MEDICATIONS USED:  NONE  SPECIMEN:  Source of Specimen:  l ureteral stone and Lavage/Washing  DISPOSITION OF SPECIMEN:  PATHOLOGY  COUNTS:  YES  TOURNIQUET:  * No tourniquets in log *  DICTATION: .Other Dictation: Dictation Number dictation [id number]  PLAN OF CARE: Discharge to home after PACU  PATIENT DISPOSITION:  PACU - hemodynamically stable.   Delay start of Pharmacological VTE agent (>24hrs) due to surgical blood loss or risk of bleeding:

## 2012-01-31 NOTE — Op Note (Signed)
NAME:  Anthony Glass, BERKOWITZ           ACCOUNT NO.:  [id number]  MEDICAL RECORD NO.:  [id number]  LOCATION:  [facility]                          FACILITY:  [facility]  PHYSICIAN:  Ky Barban, M.D.DATE OF BIRTH:  May 19, 1945  DATE OF PROCEDURE: DATE OF DISCHARGE:  01/30/2012                              OPERATIVE REPORT   PREOPERATIVE DIAGNOSIS:  Left ureteral calculus.  POSTOPERATIVE DIAGNOSIS:  Left ureteral calculus.  PROCEDURE:  Cystoscopy, ureteroscopic stone basket extraction, double-J stent insertion with string attached.  ANESTHESIA:  Spinal.  PROCEDURE:  The patient under spinal anesthesia in lithotomy position. After usual prep and drape, #25 cystoscope was introduced into the bladder.  The stent was grabbed, brought to the meatus and then guidewire was passed up into the renal pelvis.  The stent was removed and cystoscope was reintroduced and wedge catheter was introduced alongside the guidewire in the ureteral orifice.  Hypaque was injected. The distal ureter was markedly distended.  Filling defect in the ureterovesical junction was seen.  Now, the cystoscope was removed, then a short rigid ureteroscope was introduced without dilating the ureter. Went into the intramural ureter without any difficulty.  Stone was visualized, it was basketed without any problem.  After engaging in, the stone into the basket was removed completely.  Bladder was inspected. No stone or foreign bodies were seen.  After removing the ureteroscope, stent 5-French 24-cm with string was passed over the guidewire, adjusted between the renal pelvis and the bladder.  Nice loop was obtained after removing the guidewire.  Nice loop was obtained in the renal pelvis and the bladder.  All the instruments were removed.  The string was taped to his private area.  The patient left the operating room in satisfactory condition.     Ky Barban, M.D.     MIJ/MEDQ  D:  01/30/2012  T:  01/31/2012  Job:   [id number]

## 2012-02-02 ENCOUNTER — Encounter (HOSPITAL_COMMUNITY): Payer: Self-pay | Admitting: Urology

## 2012-02-06 LAB — STONE ANALYSIS

## 2014-05-05 ENCOUNTER — Emergency Department (HOSPITAL_COMMUNITY)
Admission: EM | Admit: 2014-05-05 | Discharge: 2014-05-05 | Disposition: A | Discharge status: Home / Self Care | Payer: Medicare HMO | Attending: Emergency Medicine | Admitting: Emergency Medicine

## 2014-05-05 ENCOUNTER — Encounter (HOSPITAL_COMMUNITY): Payer: Self-pay | Admitting: Emergency Medicine

## 2014-05-05 DIAGNOSIS — W260XXA Contact with knife, initial encounter: Secondary | ICD-10-CM | POA: Diagnosis not present

## 2014-05-05 DIAGNOSIS — Z87442 Personal history of urinary calculi: Secondary | ICD-10-CM | POA: Insufficient documentation

## 2014-05-05 DIAGNOSIS — Z23 Encounter for immunization: Secondary | ICD-10-CM | POA: Insufficient documentation

## 2014-05-05 DIAGNOSIS — Z87891 Personal history of nicotine dependence: Secondary | ICD-10-CM | POA: Diagnosis not present

## 2014-05-05 DIAGNOSIS — Y9389 Activity, other specified: Secondary | ICD-10-CM | POA: Insufficient documentation

## 2014-05-05 DIAGNOSIS — Y998 Other external cause status: Secondary | ICD-10-CM | POA: Insufficient documentation

## 2014-05-05 DIAGNOSIS — Z8673 Personal history of transient ischemic attack (TIA), and cerebral infarction without residual deficits: Secondary | ICD-10-CM | POA: Insufficient documentation

## 2014-05-05 DIAGNOSIS — S61012A Laceration without foreign body of left thumb without damage to nail, initial encounter: Secondary | ICD-10-CM

## 2014-05-05 DIAGNOSIS — Y9289 Other specified places as the place of occurrence of the external cause: Secondary | ICD-10-CM | POA: Insufficient documentation

## 2014-05-05 DIAGNOSIS — Z87448 Personal history of other diseases of urinary system: Secondary | ICD-10-CM | POA: Diagnosis not present

## 2014-05-05 MED ORDER — TETANUS-DIPHTH-ACELL PERTUSSIS 5-2.5-18.5 LF-MCG/0.5 IM SUSP
0.5000 mL | Freq: Once | INTRAMUSCULAR | Status: AC
  Administered 2014-05-05: 0.5 mL via INTRAMUSCULAR
  Filled 2014-05-05: qty 0.5

## 2014-05-05 MED ORDER — LIDOCAINE HCL (PF) 1 % IJ SOLN
INTRAMUSCULAR | Status: AC
  Filled 2014-05-05: qty 5

## 2014-05-05 NOTE — ED Provider Notes (Signed)
CSN: [id number]     Arrival date & time 05/05/14  1215 History   First MD Initiated Contact with Patient 05/05/14 1343     Chief Complaint  Patient presents with  . Laceration     (Consider location/radiation/quality/duration/timing/severity/associated sxs/prior Treatment) Patient is a 69 y.o. male presenting with skin laceration. The history is provided by the patient.  Laceration Location:  Hand Hand laceration location:  L finger Depth:  Through dermis Time since incident: just prior to arrival to the ED.  Anthony Glass is a 69 y.o. male who presents to the ED with a laceration to the left thumb. He was using a utility knife and it slipped and cut his thumb. Bleeding controlled and no other injuries.   Past Medical History  Diagnosis Date  . Nephrolithiasis   . BPH (benign prostatic hypertrophy)   . Diastolic dysfunction 1/75/1025    Per echo. Ejection fraction 60-65%.  . Hydronephrosis of left kidney 12/13/2011   Past Surgical History  Procedure Laterality Date  . Cystoscopy prostate w/ laser    . Transthoracic echocardiogram  12/11/2011    EF 60-65%. Right ventricle dilatation.  . Cystoscopy w/ ureteroscopy w/ lithotripsy  12/12/2011    And insertion of double-J stent on the left.  Anthony Glass extraction with basket  12/12/2011    Procedure: STONE EXTRACTION WITH BASKET;  Surgeon: Marissa Nestle, MD;  Location: [facility];  Service: Urology;  Laterality: Left;  . Transurethral resection of prostate  2012  . Extracorporeal shock wave lithotripsy  01/03/2012    Procedure: EXTRACORPOREAL SHOCK WAVE LITHOTRIPSY (ESWL);  Surgeon: Marissa Nestle, MD;  Location: [facility];  Service: Urology;  Laterality: Left;  ESWL Left Renal Calculus  . Cystoscopy w/ ureteral stent placement  01/30/2012    Procedure: CYSTOSCOPY WITH RETROGRADE PYELOGRAM/URETERAL STENT PLACEMENT;  Surgeon: Marissa Nestle, MD;  Location: [facility];  Service: Urology;  Laterality: Left;  . Stone extraction with basket   01/30/2012    Procedure: STONE EXTRACTION WITH BASKET;  Surgeon: Marissa Nestle, MD;  Location: [facility];  Service: Urology;  Laterality: Left;   History reviewed. No pertinent family history. History  Substance Use Topics  . Smoking status: Former Smoker -- 1.00 packs/day for 20 years    Types: Cigarettes  . Smokeless tobacco: Former Systems developer    Quit date: 12/27/1981  . Alcohol Use: Yes     Comment: occassional use of liquor    Review of Systems Negative except as stated in HPI   Allergies  Review of patient's allergies indicates no known allergies.  Home Medications   Prior to Admission medications   Not on File   BP 134/92 mmHg  Pulse 64  Temp(Src) 98.6 F (37 C) (Oral)  Resp 18  Ht 6\' 1"  (1.854 m)  Wt 172 lb (78.019 kg)  BMI 22.70 kg/m2  SpO2 96% Physical Exam  Constitutional: He is oriented to person, place, and time. He appears well-developed and well-nourished.  HENT:  Head: Normocephalic.  Eyes: EOM are normal.  Neck: Neck supple.  Cardiovascular: Normal rate.   Pulmonary/Chest: Effort normal.  Abdominal: Soft. There is no tenderness.  Musculoskeletal: Normal range of motion.       Left hand: He exhibits tenderness and laceration. He exhibits normal range of motion, normal capillary refill, no deformity and no swelling. Normal sensation noted. Normal strength noted.       Hands: Neurological: He is alert and oriented to person, place, and time. No cranial  nerve deficit.  Skin: Skin is warm and dry.  Psychiatric: He has a normal mood and affect. His behavior is normal.  Nursing note and vitals reviewed.   ED Course  Procedures ( LACERATION REPAIR Performed by: Daquawn Seelman Authorized by: Ginnifer Creelman Consent: Verbal consent obtained. Risks and benefits: risks, benefits and alternatives were discussed Consent given by: patient Patient identity confirmed: provided demographic data Prepped and Draped in normal sterile fashion Wound explored  Laceration  Location: left thumb  Laceration Length: 2 cm  No Foreign Bodies seen or palpated  Anesthesia: local infiltration  Local anesthetic: lidocaine 1% without epinephrine  Anesthetic total: 2 ml  Irrigation method: syringe Amount of cleaning: standard  Skin closure: 5-0 prolene  Number of sutures: 5  Technique: interrupted  Patient tolerance: Patient tolerated the procedure well with no immediate complications. Dressing applied Tetanus updated  MDM  69 y.o. male with a laceration to the left thumb s/p injury with utility knife. Stable for discharge without neurovascular deficits. He will follow up in 7 days for suture removal. He will return sooner for any problems.  Final diagnoses:  Laceration of thumb, left, initial encounter      [facility], NP 05/05/14 [place], MD 05/06/14 [phone]

## 2014-05-05 NOTE — ED Notes (Signed)
Pt states he cut his left thumb with a utility knife about 10 minutes ago.  Bleeding controlled with pressure.

## 2014-12-08 ENCOUNTER — Emergency Department (HOSPITAL_COMMUNITY)
Admission: EM | Admit: 2014-12-08 | Discharge: 2014-12-08 | Disposition: A | Discharge status: Home / Self Care | Payer: Medicare HMO | Attending: Emergency Medicine | Admitting: Emergency Medicine

## 2014-12-08 ENCOUNTER — Encounter (HOSPITAL_COMMUNITY): Payer: Self-pay | Admitting: Emergency Medicine

## 2014-12-08 ENCOUNTER — Emergency Department (HOSPITAL_COMMUNITY): Payer: Medicare HMO

## 2014-12-08 DIAGNOSIS — Z8679 Personal history of other diseases of the circulatory system: Secondary | ICD-10-CM | POA: Diagnosis not present

## 2014-12-08 DIAGNOSIS — Z87442 Personal history of urinary calculi: Secondary | ICD-10-CM | POA: Diagnosis not present

## 2014-12-08 DIAGNOSIS — N390 Urinary tract infection, site not specified: Secondary | ICD-10-CM | POA: Insufficient documentation

## 2014-12-08 DIAGNOSIS — Z87438 Personal history of other diseases of male genital organs: Secondary | ICD-10-CM | POA: Diagnosis not present

## 2014-12-08 DIAGNOSIS — Z87891 Personal history of nicotine dependence: Secondary | ICD-10-CM | POA: Insufficient documentation

## 2014-12-08 DIAGNOSIS — M549 Dorsalgia, unspecified: Secondary | ICD-10-CM | POA: Diagnosis present

## 2014-12-08 LAB — CBC WITH DIFFERENTIAL/PLATELET
BASOS ABS: 0 10*3/uL (ref 0.0–0.1)
BASOS PCT: 1 %
EOS ABS: 0.1 10*3/uL (ref 0.0–0.7)
EOS PCT: 2 %
HCT: 41 % (ref 39.0–52.0)
Hemoglobin: 13.9 g/dL (ref 13.0–17.0)
Lymphocytes Relative: 26 %
Lymphs Abs: 1.4 10*3/uL (ref 0.7–4.0)
MCH: 31.4 pg (ref 26.0–34.0)
MCHC: 33.9 g/dL (ref 30.0–36.0)
MCV: 92.6 fL (ref 78.0–100.0)
Monocytes Absolute: 0.5 10*3/uL (ref 0.1–1.0)
Monocytes Relative: 10 %
Neutro Abs: 3.4 10*3/uL (ref 1.7–7.7)
Neutrophils Relative %: 61 %
PLATELETS: 269 10*3/uL (ref 150–400)
RBC: 4.43 MIL/uL (ref 4.22–5.81)
RDW: 13.8 % (ref 11.5–15.5)
WBC: 5.5 10*3/uL (ref 4.0–10.5)

## 2014-12-08 LAB — URINALYSIS, ROUTINE W REFLEX MICROSCOPIC
BILIRUBIN URINE: NEGATIVE
Glucose, UA: NEGATIVE mg/dL
Ketones, ur: NEGATIVE mg/dL
Nitrite: NEGATIVE
PROTEIN: NEGATIVE mg/dL
Specific Gravity, Urine: 1.01 (ref 1.005–1.030)
UROBILINOGEN UA: 0.2 mg/dL (ref 0.0–1.0)
pH: 6 (ref 5.0–8.0)

## 2014-12-08 LAB — BASIC METABOLIC PANEL
ANION GAP: 7 (ref 5–15)
BUN: 16 mg/dL (ref 6–20)
CO2: 29 mmol/L (ref 22–32)
Calcium: 9.6 mg/dL (ref 8.9–10.3)
Chloride: 101 mmol/L (ref 101–111)
Creatinine, Ser: 0.94 mg/dL (ref 0.61–1.24)
GFR calc Af Amer: >60 mL/min (ref >60)
Glucose, Bld: 90 mg/dL (ref 65–99)
POTASSIUM: 4.1 mmol/L (ref 3.5–5.1)
SODIUM: 137 mmol/L (ref 135–145)

## 2014-12-08 LAB — URINE MICROSCOPIC-ADD ON

## 2014-12-08 MED ORDER — CIPROFLOXACIN HCL 250 MG PO TABS
500.0000 mg | ORAL_TABLET | Freq: Once | ORAL | Status: AC
  Administered 2014-12-08: 500 mg via ORAL
  Filled 2014-12-08: qty 2

## 2014-12-08 MED ORDER — HYDROCODONE-ACETAMINOPHEN 5-325 MG PO TABS: 2.0000 | ORAL_TABLET | ORAL | Status: DC | PRN

## 2014-12-08 MED ORDER — SODIUM CHLORIDE 0.9 % IV BOLUS (SEPSIS): 1000.0000 mL | Freq: Once | INTRAVENOUS | Status: DC

## 2014-12-08 MED ORDER — CIPROFLOXACIN HCL 500 MG PO TABS: 500.0000 mg | ORAL_TABLET | Freq: Two times a day (BID) | ORAL | Status: DC

## 2014-12-08 NOTE — ED Provider Notes (Addendum)
CSN: [id number]     Arrival date & time 12/08/14  1412 History   First MD Initiated Contact with Patient 12/08/14 1737     Chief Complaint  Patient presents with  . Back Pain      HPI  Patient presents for evaluation of flank pain and dysuria. Urinary frequency and pain. Flank pain intermittent over the last 4-5 days history of stones history of infection. No fevers no chills. No nausea no vomiting. Minimal symptoms currently.  Past Medical History  Diagnosis Date  . Nephrolithiasis   . BPH (benign prostatic hypertrophy)   . Diastolic dysfunction 2/53/6644    Per echo. Ejection fraction 60-65%.  . Hydronephrosis of left kidney 12/13/2011   Past Surgical History  Procedure Laterality Date  . Cystoscopy prostate w/ laser    . Transthoracic echocardiogram  12/11/2011    EF 60-65%. Right ventricle dilatation.  . Cystoscopy w/ ureteroscopy w/ lithotripsy  12/12/2011    And insertion of double-J stent on the left.  [name] extraction with basket  12/12/2011    Procedure: STONE EXTRACTION WITH BASKET;  Surgeon: Marissa Nestle, MD;  Location: [facility];  Service: Urology;  Laterality: Left;  . Transurethral resection of prostate  2012  . Extracorporeal shock wave lithotripsy  01/03/2012    Procedure: EXTRACORPOREAL SHOCK WAVE LITHOTRIPSY (ESWL);  Surgeon: Marissa Nestle, MD;  Location: [facility];  Service: Urology;  Laterality: Left;  ESWL Left Renal Calculus  . Cystoscopy w/ ureteral stent placement  01/30/2012    Procedure: CYSTOSCOPY WITH RETROGRADE PYELOGRAM/URETERAL STENT PLACEMENT;  Surgeon: Marissa Nestle, MD;  Location: [facility];  Service: Urology;  Laterality: Left;  . Stone extraction with basket  01/30/2012    Procedure: STONE EXTRACTION WITH BASKET;  Surgeon: Marissa Nestle, MD;  Location: [facility];  Service: Urology;  Laterality: Left;   History reviewed. No pertinent family history. Social History  Substance Use Topics  . Smoking status: Former Smoker -- 0.00 packs/day  for 20 years    Types: Cigarettes  . Smokeless tobacco: Former Systems developer    Quit date: 12/27/1981  . Alcohol Use: Yes     Comment: occassional use of liquor    Review of Systems  Constitutional: Negative for chills, diaphoresis, appetite change and fatigue.  HENT: Negative for mouth sores, sore throat and trouble swallowing.   Eyes: Negative for visual disturbance.  Respiratory: Negative for cough, chest tightness, shortness of breath and wheezing.   Cardiovascular: Negative for chest pain.  Gastrointestinal: Negative for nausea, vomiting, abdominal pain, diarrhea and abdominal distention.  Endocrine: Negative for polydipsia, polyphagia and polyuria.  Genitourinary: Positive for dysuria and flank pain. Negative for frequency, hematuria and decreased urine volume.  Musculoskeletal: Negative for gait problem.  Skin: Negative for color change, pallor and rash.  Neurological: Negative for dizziness, syncope, light-headedness and headaches.  Hematological: Does not bruise/bleed easily.  Psychiatric/Behavioral: Negative for behavioral problems and confusion.      Allergies  Review of patient's allergies indicates no known allergies.  Home Medications   Prior to Admission medications   Medication Sig Start Date End Date Taking? Authorizing Provider  ciprofloxacin (CIPRO) 500 MG tablet Take 1 tablet (500 mg total) by mouth 2 (two) times daily. 12/08/14   Tanna Furry, MD  HYDROcodone-acetaminophen (NORCO/VICODIN) 5-325 MG per tablet Take 2 tablets by mouth every 4 (four) hours as needed. 12/08/14   Tanna Furry, MD   BP 152/96 mmHg  Pulse 59  Temp(Src) 98 F (36.7 C) (Oral)  Resp 16  Ht 6' 1.5" (1.867 m)  Wt 170 lb (77.111 kg)  BMI 22.12 kg/m2  SpO2 95% Physical Exam  Constitutional: He is oriented to person, place, and time. He appears well-developed and well-nourished. No distress.  HENT:  Head: Normocephalic.  Eyes: Conjunctivae are normal. Pupils are equal, round, and reactive to  light. No scleral icterus.  Neck: Normal range of motion. Neck supple. No thyromegaly present.  Cardiovascular: Normal rate and regular rhythm.  Exam reveals no gallop and no friction rub.   No murmur heard. Pulmonary/Chest: Effort normal and breath sounds normal. No respiratory distress. He has no wheezes. He has no rales.  Abdominal: Soft. Bowel sounds are normal. He exhibits no distension. There is no tenderness. There is no rebound.    Musculoskeletal: Normal range of motion.  Neurological: He is alert and oriented to person, place, and time.  Skin: Skin is warm and dry. No rash noted.  Psychiatric: He has a normal mood and affect. His behavior is normal.    ED Course  Procedures (including critical care time) Labs Review Labs Reviewed  URINALYSIS, [place] (NOT AT [facility]) - Abnormal; Notable for the following:    APPearance HAZY (*)    Hgb urine dipstick TRACE (*)    Leukocytes, UA MODERATE (*)    All other components within normal limits  URINE MICROSCOPIC-ADD ON - Abnormal; Notable for the following:    Squamous Epithelial / LPF MANY (*)    Bacteria, UA MANY (*)    All other components within normal limits  CBC WITH DIFFERENTIAL/PLATELET  BASIC METABOLIC PANEL    Imaging Review Ct Renal Stone Study  12/08/2014   CLINICAL DATA:  Left flank pain, chills  EXAM: CT ABDOMEN AND PELVIS WITHOUT CONTRAST  TECHNIQUE: Multidetector CT imaging of the abdomen and pelvis was performed following the standard protocol without IV contrast.  COMPARISON:  01/12/2012  FINDINGS: The lung bases are unremarkable. Sagittal images of the spine shows degenerative changes thoracolumbar spine. Unenhanced liver shows no biliary ductal dilatation. No calcified gallstones are noted within gallbladder. Stool noted throughout the colon. Atherosclerotic calcifications of abdominal aorta and iliac arteries. Unenhanced pancreas, spleen and adrenal glands are unremarkable.  No aortic aneurysm.   No small bowel obstruction.  No pericecal inflammation.  At least 2 nonobstructive calcified calculi are noted in lower pole of the left kidney the largest measures 7.5 mm. There is mild left hydronephrosis and left hydroureter. No calcified left ureteral calculi. Findings may be due to a recent passed left ureteral calculus or left urinary tract inflammation. Clinical correlation is necessary.  There is a distended urinary bladder. No calcified calculi are noted within urinary bladder. TURP defect is noted posterior aspect of the bladder. No distal colonic obstruction.  IMPRESSION: 1. There is left nonobstructive nephrolithiasis. Mild left hydronephrosis and left hydroureter. No calcified ureteral calculi are noted. Findings may be due to a recent passed left ureteral calculus or left urinary tract inflammation/infection. No calcified calculi are noted within urinary bladder. 2. There is a distended urinary bladder. Post TURP defect noted posterior aspect of the bladder. 3. Stool noted throughout the colon. 4. No small bowel obstruction.   Electronically Signed   By: Lahoma Crocker M.D.   On: 12/08/2014 18:47   I have personally reviewed and evaluated these images and lab results as part of my medical decision-making.   EKG Interpretation None      MDM   Final diagnoses:  UTI (lower  urinary tract infection)    No obstruction by CT. Multiple wbc's and bacteria. Culture pending. Intact renal function. Given by mouth Cipro. Discharged home Cipro Vicodin push fluids primary care follow-up.     Tanna Furry, MD 12/08/14 1944  Tanna Furry, MD 12/08/14 1945

## 2014-12-08 NOTE — ED Notes (Signed)
Pt alert & oriented x4, stable gait. Patient given discharge instructions, paperwork & prescription(s). Patient informed not to drive, operate any equipment & handel any important documents 4 hours after taking pain medication. Patient  instructed to stop at the registration desk to finish any additional paperwork. Patient  verbalized understanding. Pt left department w/ no further questions. 

## 2014-12-08 NOTE — Discharge Instructions (Signed)

## 2014-12-08 NOTE — ED Notes (Addendum)
Pt reports back pain,chills, and left flank pain. Pt reports nausea denies v/d,dysuria.

## 2014-12-26 ENCOUNTER — Encounter (HOSPITAL_COMMUNITY): Payer: Self-pay | Admitting: Emergency Medicine

## 2014-12-26 ENCOUNTER — Emergency Department (HOSPITAL_COMMUNITY): Payer: Medicare HMO

## 2014-12-26 ENCOUNTER — Emergency Department (HOSPITAL_COMMUNITY)
Admission: EM | Admit: 2014-12-26 | Discharge: 2014-12-26 | Disposition: A | Discharge status: Home / Self Care | Payer: Medicare HMO | Attending: Emergency Medicine | Admitting: Emergency Medicine

## 2014-12-26 DIAGNOSIS — R109 Unspecified abdominal pain: Secondary | ICD-10-CM

## 2014-12-26 DIAGNOSIS — N23 Unspecified renal colic: Secondary | ICD-10-CM | POA: Insufficient documentation

## 2014-12-26 DIAGNOSIS — Z8679 Personal history of other diseases of the circulatory system: Secondary | ICD-10-CM | POA: Insufficient documentation

## 2014-12-26 DIAGNOSIS — Z87891 Personal history of nicotine dependence: Secondary | ICD-10-CM | POA: Insufficient documentation

## 2014-12-26 DIAGNOSIS — Z87438 Personal history of other diseases of male genital organs: Secondary | ICD-10-CM | POA: Insufficient documentation

## 2014-12-26 DIAGNOSIS — N132 Hydronephrosis with renal and ureteral calculous obstruction: Secondary | ICD-10-CM | POA: Diagnosis not present

## 2014-12-26 LAB — COMPREHENSIVE METABOLIC PANEL
ALK PHOS: 57 U/L (ref 38–126)
ALT: 21 U/L (ref 17–63)
AST: 27 U/L (ref 15–41)
Albumin: 3.8 g/dL (ref 3.5–5.0)
Anion gap: 4 — ABNORMAL LOW (ref 5–15)
BILIRUBIN TOTAL: 0.6 mg/dL (ref 0.3–1.2)
BUN: 24 mg/dL — AB (ref 6–20)
CHLORIDE: 105 mmol/L (ref 101–111)
CO2: 27 mmol/L (ref 22–32)
Calcium: 9.6 mg/dL (ref 8.9–10.3)
Creatinine, Ser: 1.07 mg/dL (ref 0.61–1.24)
GFR calc Af Amer: >60 mL/min (ref >60)
GFR calc non Af Amer: >60 mL/min (ref >60)
Glucose, Bld: 96 mg/dL (ref 65–99)
POTASSIUM: 4.2 mmol/L (ref 3.5–5.1)
Sodium: 136 mmol/L (ref 135–145)
Total Protein: 6.5 g/dL (ref 6.5–8.1)

## 2014-12-26 LAB — CBC WITH DIFFERENTIAL/PLATELET
Basophils Absolute: 0 10*3/uL (ref 0.0–0.1)
Basophils Relative: 0 %
Eosinophils Absolute: 0.1 10*3/uL (ref 0.0–0.7)
Eosinophils Relative: 1 %
HEMATOCRIT: 40.9 % (ref 39.0–52.0)
HEMOGLOBIN: 14.1 g/dL (ref 13.0–17.0)
LYMPHS ABS: 1 10*3/uL (ref 0.7–4.0)
LYMPHS PCT: 18 %
MCH: 31.5 pg (ref 26.0–34.0)
MCHC: 34.5 g/dL (ref 30.0–36.0)
MCV: 91.3 fL (ref 78.0–100.0)
MONOS PCT: 9 %
Monocytes Absolute: 0.5 10*3/uL (ref 0.1–1.0)
NEUTROS ABS: 4.1 10*3/uL (ref 1.7–7.7)
NEUTROS PCT: 72 %
Platelets: 231 10*3/uL (ref 150–400)
RBC: 4.48 MIL/uL (ref 4.22–5.81)
RDW: 14.1 % (ref 11.5–15.5)
WBC: 5.7 10*3/uL (ref 4.0–10.5)

## 2014-12-26 LAB — URINALYSIS, ROUTINE W REFLEX MICROSCOPIC
Bilirubin Urine: NEGATIVE
GLUCOSE, UA: NEGATIVE mg/dL
Ketones, ur: NEGATIVE mg/dL
LEUKOCYTES UA: NEGATIVE
Nitrite: NEGATIVE
PROTEIN: NEGATIVE mg/dL
Urobilinogen, UA: 0.2 mg/dL (ref 0.0–1.0)
pH: 6 (ref 5.0–8.0)

## 2014-12-26 LAB — URINE MICROSCOPIC-ADD ON

## 2014-12-26 MED ORDER — SODIUM CHLORIDE 0.9 % IV BOLUS (SEPSIS)
1000.0000 mL | Freq: Once | INTRAVENOUS | Status: AC
  Administered 2014-12-26: 1000 mL via INTRAVENOUS

## 2014-12-26 MED ORDER — HYDROCODONE-ACETAMINOPHEN 5-325 MG PO TABS: 2.0000 | ORAL_TABLET | ORAL | Status: DC | PRN

## 2014-12-26 MED ORDER — KETOROLAC TROMETHAMINE 30 MG/ML IJ SOLN
30.0000 mg | Freq: Once | INTRAMUSCULAR | Status: AC
  Administered 2014-12-26: 30 mg via INTRAVENOUS
  Filled 2014-12-26: qty 1

## 2014-12-26 MED ORDER — ONDANSETRON HCL 4 MG/2ML IJ SOLN
4.0000 mg | Freq: Four times a day (QID) | INTRAMUSCULAR | Status: DC | PRN
  Administered 2014-12-26: 4 mg via INTRAVENOUS
  Filled 2014-12-26: qty 2

## 2014-12-26 MED ORDER — SODIUM CHLORIDE 0.9 % IV SOLN: Freq: Once | INTRAVENOUS | Status: DC

## 2014-12-26 NOTE — ED Notes (Signed)
States he was recently treated for uti, states he feels like the infection has returned

## 2014-12-26 NOTE — ED Provider Notes (Signed)
CSN: [id number]     Arrival date & time 12/26/14  1410 History   First MD Initiated Contact with Patient 12/26/14 1411     Chief Complaint  Patient presents with  . Flank Pain     (Consider location/radiation/quality/duration/timing/severity/associated sxs/prior Treatment) HPI Comments: 69 y.o. Male with history of kidney stones, BPH presents for left flank pain.  The patient reports that the pain is similar to what he was experiencing when he was seen here on 12/08/14 and diagnosed with a kidney infection.  He reports that 8 days ago he completed his course of Cipro and was feeling better but a couple days after he started feeling the pain coming back and has been having intermittent nausea.  Denies fever, chills, diarrhea, abdominal pain.  The pain does not radiate.  Patient is a 69 y.o. male presenting with flank pain.  Flank Pain Pertinent negatives include no chest pain, no abdominal pain and no shortness of breath.    Past Medical History  Diagnosis Date  . Nephrolithiasis   . BPH (benign prostatic hypertrophy)   . Diastolic dysfunction 4/40/3474    Per echo. Ejection fraction 60-65%.  . Hydronephrosis of left kidney 12/13/2011   Past Surgical History  Procedure Laterality Date  . Cystoscopy prostate w/ laser    . Transthoracic echocardiogram  12/11/2011    EF 60-65%. Right ventricle dilatation.  . Cystoscopy w/ ureteroscopy w/ lithotripsy  12/12/2011    And insertion of double-J stent on the left.  [name] extraction with basket  12/12/2011    Procedure: STONE EXTRACTION WITH BASKET;  Surgeon: Marissa Nestle, MD;  Location: [facility];  Service: Urology;  Laterality: Left;  . Transurethral resection of prostate  2012  . Extracorporeal shock wave lithotripsy  01/03/2012    Procedure: EXTRACORPOREAL SHOCK WAVE LITHOTRIPSY (ESWL);  Surgeon: Marissa Nestle, MD;  Location: [facility];  Service: Urology;  Laterality: Left;  ESWL Left Renal Calculus  . Cystoscopy w/ ureteral stent  placement  01/30/2012    Procedure: CYSTOSCOPY WITH RETROGRADE PYELOGRAM/URETERAL STENT PLACEMENT;  Surgeon: Marissa Nestle, MD;  Location: [facility];  Service: Urology;  Laterality: Left;  . Stone extraction with basket  01/30/2012    Procedure: STONE EXTRACTION WITH BASKET;  Surgeon: Marissa Nestle, MD;  Location: [facility];  Service: Urology;  Laterality: Left;   History reviewed. No pertinent family history. Social History  Substance Use Topics  . Smoking status: Former Smoker -- 0.00 packs/day for 20 years    Types: Cigarettes  . Smokeless tobacco: Former Systems developer    Quit date: 12/27/1981  . Alcohol Use: Yes     Comment: occassional use of liquor    Review of Systems  Constitutional: Negative for fever, chills and fatigue.  HENT: Negative for congestion, postnasal drip and rhinorrhea.   Eyes: Negative for pain and redness.  Respiratory: Negative for cough, chest tightness and shortness of breath.   Cardiovascular: Negative for chest pain and palpitations.  Gastrointestinal: Positive for nausea. Negative for vomiting, abdominal pain and diarrhea.  Genitourinary: Positive for flank pain (left).  Musculoskeletal: Negative for myalgias and back pain.  Skin: Negative for rash.  Neurological: Negative for dizziness, weakness and light-headedness.  Hematological: Does not bruise/bleed easily.      Allergies  Review of patient's allergies indicates no known allergies.  Home Medications   Prior to Admission medications   Medication Sig Start Date End Date Taking? Authorizing Provider  ciprofloxacin (CIPRO) 500 MG tablet Take 1 tablet (  500 mg total) by mouth 2 (two) times daily. Patient not taking: Reported on 12/26/2014 12/08/14   Tanna Furry, MD  HYDROcodone-acetaminophen (NORCO/VICODIN) 5-325 MG tablet Take 2 tablets by mouth every 4 (four) hours as needed. 12/26/14   Harvel Quale, MD   BP 149/109 mmHg  Pulse 67  Temp(Src) 98 F (36.7 C) (Oral)  Resp 18  Ht 6' 1.5" (1.867 m)   Wt 172 lb (78.019 kg)  BMI 22.38 kg/m2  SpO2 99% Physical Exam  Constitutional: He is oriented to person, place, and time. He appears well-developed and well-nourished. No distress.  HENT:  Head: Normocephalic and atraumatic.  Right Ear: External ear normal.  Left Ear: External ear normal.  Mouth/Throat: Oropharynx is clear and moist. No oropharyngeal exudate.  Eyes: EOM are normal. Pupils are equal, round, and reactive to light.  Neck: Normal range of motion. Neck supple.  Cardiovascular: Normal rate, regular rhythm, normal heart sounds and intact distal pulses.   No murmur heard. Pulmonary/Chest: Effort normal. No respiratory distress. He has no wheezes. He has no rales.  Abdominal: Soft. He exhibits no distension. There is no tenderness.  Musculoskeletal: He exhibits no edema.  Neurological: He is alert and oriented to person, place, and time.  Skin: Skin is warm and dry. No rash noted. He is not diaphoretic.  Vitals reviewed.   ED Course  Procedures (including critical care time) Labs Review Labs Reviewed  COMPREHENSIVE METABOLIC PANEL - Abnormal; Notable for the following:    BUN 24 (*)    Anion gap 4 (*)    All other components within normal limits  URINALYSIS, ROUTINE W REFLEX MICROSCOPIC (NOT AT [facility]) - Abnormal; Notable for the following:    Specific Gravity, Urine <1.005 (*)    Hgb urine dipstick MODERATE (*)    All other components within normal limits  URINE MICROSCOPIC-ADD ON - Abnormal; Notable for the following:    Squamous Epithelial / LPF FEW (*)    Bacteria, UA FEW (*)    All other components within normal limits  URINE CULTURE  CBC WITH DIFFERENTIAL/PLATELET    Imaging Review Ct Renal Stone Study  12/26/2014   CLINICAL DATA:  Left flank pain.  EXAM: CT ABDOMEN AND PELVIS WITHOUT CONTRAST  TECHNIQUE: Multidetector CT imaging of the abdomen and pelvis was performed following the standard protocol without IV contrast.  COMPARISON:  CT scan of December 08, 2014.  FINDINGS: Moderate degenerative disc disease is noted at L4-5. Visualized lung bases are unremarkable.  Gallbladder is contracted without evidence of gallstones. No focal abnormality is noted in the liver, spleen or pancreas on these unenhanced images. Stable right adrenal adenoma. Left adrenal gland appears normal. Right kidney and ureter appear normal. 11 mm calculus is noted in lower pole collecting system of left kidney. Severe left hydronephrosis with perinephric stranding is noted secondary to 13 mm calculus lodged at the left ureteropelvic junction. Atherosclerosis of abdominal aorta is noted without aneurysm formation. The appendix appears normal. There is no evidence of bowel obstruction. Sigmoid diverticulosis is noted without inflammation. Urinary bladder appears normal. No abnormal fluid collection is noted. No significant adenopathy is noted.  IMPRESSION: Severe left hydronephrosis is noted secondary to 13 mm calculus lodged at the left ureteropelvic junction. 11 mm calculus is noted in lower pole collecting system of left kidney is well.   Electronically Signed   By: Marijo Conception, M.D.   On: 12/26/2014 16:11   I have personally reviewed and evaluated these images  and lab results as part of my medical decision-making.   EKG Interpretation None      MDM  Patient seen and evaluated in stable condition.  UA with few squamous cells, few WBCs, few bacteria but not consistent with infection.  Labs otherwise unremarkable.  Patient afebrile with stable vitals and pain controlled with fluids and toradol.  CT with large 13 mm stone at the UPJ on the left side.  Discussed with Dr. Louis Meckel from urology who recommended that the patient be given the option of either a stent today or lithotripsy next week when he followed up.  Discussed with patient who would prefer to be discharge and felt comfortable was well appearing.  He was given strict return precautions which he expressed understanding  of and he was discharged to follow up with urology as soon as possible and with a prescription for Norco.  Inbox message sent to Dr. Louis Meckel regarding patient information to help coordinate follow up. Final diagnoses:  Left flank pain  Renal colic on left side    1. Left renal colic  2. Left hydronephrosis    Harvel Quale, MD 12/26/14 [phone]

## 2014-12-26 NOTE — ED Notes (Signed)
Pt reports left flank pain and urinary frequency. Pt reports finished abx on Friday. Pt reports frequency and nausea returned on Sunday. nad noted. Pt denies any known fevers.

## 2014-12-26 NOTE — Discharge Instructions (Signed)
Renal Colic  You have a large stone at the site where your kidney and ureter connect that is causing obstruction of your urine.  Take the pain medication as needed and follow up with urology as soon as possible.  Return immediately for fever,  acute illness, nausea and vomiting where you cannot hold any food or liquids down, or uncontrollable pain.  Renal colic is pain that is caused by passing a kidney stone. The pain can be sharp and severe. It may be felt in the back, abdomen, side (flank), or groin. It can cause nausea. Renal colic can come and go. HOME CARE INSTRUCTIONS Watch your condition for any changes. The following actions may help to lessen any discomfort that you are feeling:  Take medicines only as directed by your health care provider.  Ask your health care provider if it is okay to take over-the-counter pain medicine.  Drink enough fluid to keep your urine clear or pale yellow. Drink 6-8 glasses of water each day.  Limit the amount of salt that you eat to less than 2 grams per day.  Reduce the amount of protein in your diet. Eat less meat, fish, nuts, and dairy.  Avoid foods such as spinach, rhubarb, nuts, or bran. These may make kidney stones more likely to form. SEEK MEDICAL CARE IF:  You have a fever or chills.  Your urine smells bad or looks cloudy.  You have pain or burning when you pass urine. SEEK IMMEDIATE MEDICAL CARE IF:  Your flank pain or groin pain suddenly worsens.  You become confused or disoriented or you lose consciousness.   This information is not intended to replace advice given to you by your health care provider. Make sure you discuss any questions you have with your health care provider.   Document Released: 12/14/2004 Document Revised: 03/27/2014 Document Reviewed: 01/14/2014 Elsevier Interactive Patient Education [employer].

## 2014-12-28 LAB — URINE CULTURE

## 2014-12-29 ENCOUNTER — Ambulatory Visit (INDEPENDENT_AMBULATORY_CARE_PROVIDER_SITE_OTHER): Payer: Medicare HMO | Admitting: Urology

## 2014-12-29 DIAGNOSIS — R339 Retention of urine, unspecified: Secondary | ICD-10-CM | POA: Diagnosis not present

## 2014-12-29 DIAGNOSIS — N201 Calculus of ureter: Secondary | ICD-10-CM

## 2014-12-29 DIAGNOSIS — N359 Urethral stricture, unspecified: Secondary | ICD-10-CM

## 2014-12-29 DIAGNOSIS — N200 Calculus of kidney: Secondary | ICD-10-CM | POA: Diagnosis not present

## 2015-01-01 ENCOUNTER — Other Ambulatory Visit: Payer: Self-pay | Admitting: Urology

## 2015-01-04 ENCOUNTER — Other Ambulatory Visit: Payer: Self-pay | Admitting: Urology

## 2015-01-04 DIAGNOSIS — N200 Calculus of kidney: Secondary | ICD-10-CM

## 2015-02-10 ENCOUNTER — Encounter (HOSPITAL_COMMUNITY)
Admission: RE | Admit: 2015-02-10 | Discharge: 2015-02-10 | Disposition: A | Discharge status: Home / Self Care | Payer: Medicare HMO | Source: Ambulatory Visit | Attending: Urology | Admitting: Urology

## 2015-02-10 ENCOUNTER — Encounter (HOSPITAL_COMMUNITY): Payer: Self-pay

## 2015-02-10 DIAGNOSIS — N200 Calculus of kidney: Secondary | ICD-10-CM | POA: Diagnosis not present

## 2015-02-10 DIAGNOSIS — N201 Calculus of ureter: Secondary | ICD-10-CM | POA: Insufficient documentation

## 2015-02-10 DIAGNOSIS — Z01818 Encounter for other preprocedural examination: Secondary | ICD-10-CM | POA: Insufficient documentation

## 2015-02-10 HISTORY — DX: Malignant (primary) neoplasm, unspecified: C80.1

## 2015-02-10 HISTORY — DX: Personal history of other medical treatment: Z92.89

## 2015-02-10 HISTORY — DX: Hypothyroidism, unspecified: E03.9

## 2015-02-10 HISTORY — DX: Disorder of the skin and subcutaneous tissue, unspecified: L98.9

## 2015-02-10 HISTORY — DX: Personal history of other malignant neoplasm of skin: Z85.828

## 2015-02-10 LAB — BASIC METABOLIC PANEL
Anion gap: 8 (ref 5–15)
BUN: 22 mg/dL — ABNORMAL HIGH (ref 6–20)
CHLORIDE: 105 mmol/L (ref 101–111)
CO2: 24 mmol/L (ref 22–32)
Calcium: 9.2 mg/dL (ref 8.9–10.3)
Creatinine, Ser: 1.01 mg/dL (ref 0.61–1.24)
GFR calc non Af Amer: >60 mL/min (ref >60)
Glucose, Bld: 84 mg/dL (ref 65–99)
POTASSIUM: 4.1 mmol/L (ref 3.5–5.1)
SODIUM: 137 mmol/L (ref 135–145)

## 2015-02-10 LAB — CBC
HEMATOCRIT: 39.1 % (ref 39.0–52.0)
HEMOGLOBIN: 13.2 g/dL (ref 13.0–17.0)
MCH: 30.4 pg (ref 26.0–34.0)
MCHC: 33.8 g/dL (ref 30.0–36.0)
MCV: 90.1 fL (ref 78.0–100.0)
Platelets: 270 10*3/uL (ref 150–400)
RBC: 4.34 MIL/uL (ref 4.22–5.81)
RDW: 14 % (ref 11.5–15.5)
WBC: 5.8 10*3/uL (ref 4.0–10.5)

## 2015-02-10 LAB — ABO/RH: ABO/RH(D): O POS

## 2015-02-10 NOTE — Patient Instructions (Addendum)
YOUR PROCEDURE IS SCHEDULED ON :  02/18/15  REPORT TO [facility] MAIN ENTRANCE FOLLOW SIGNS TO RADIOLOGY CHECK IN AT 7:00 AM  CALL THIS NUMBER IF YOU HAVE PROBLEMS THE MORNING OF SURGERY [phone]  REMEMBER:ONLY 1 PER PERSON MAY GO TO SHORT STAY WITH YOU TO GET READY THE MORNING OF YOUR SURGERY  DO NOT EAT FOOD OR DRINK LIQUIDS AFTER MIDNIGHT  TAKE THESE MEDICINES THE MORNING OF SURGERY: MAY TAKE HYDROCODONE IF NEEDED FOR PAIN  YOU MAY NOT HAVE ANY METAL ON YOUR BODY INCLUDING HAIR PINS AND PIERCING'S. DO NOT WEAR JEWELRY, MAKEUP, LOTIONS, POWDERS OR PERFUMES. DO NOT WEAR NAIL POLISH. DO NOT SHAVE 48 HRS PRIOR TO SURGERY. MEN MAY SHAVE FACE AND NECK.  DO NOT [facility] IS NOT RESPONSIBLE FOR VALUABLES.  CONTACTS, DENTURES OR PARTIALS MAY NOT BE WORN TO SURGERY. LEAVE SUITCASE IN CAR. CAN BE BROUGHT TO ROOM AFTER SURGERY.  PATIENTS DISCHARGED THE DAY OF SURGERY WILL NOT BE ALLOWED TO DRIVE HOME.  PLEASE READ OVER THE FOLLOWING INSTRUCTION SHEETS _________________________________________________________________________________                                          [place] [facility] - PREPARING FOR SURGERY  Before surgery, you can play an important role.  Because skin is not sterile, your skin needs to be as free of germs as possible.  You can reduce the number of germs on your skin by washing with CHG (chlorahexidine gluconate) soap before surgery.  CHG is an antiseptic cleaner which kills germs and bonds with the skin to continue killing germs even after washing. Please DO NOT use if you have an allergy to CHG or antibacterial soaps.  If your skin becomes reddened/irritated stop using the CHG and inform your nurse when you arrive at Short Stay. Do not shave (including legs and underarms) for at least 48 hours prior to the first CHG shower.  You may shave your face. Please follow these instructions carefully:   1.  Shower with CHG Soap  the night before surgery and the  morning of Surgery.   2.  If you choose to wash your hair, wash your hair first as usual with your  normal  Shampoo.   3.  After you shampoo, rinse your hair and body thoroughly to remove the  shampoo.                                         4.  Use CHG as you would any other liquid soap.  You can apply chg directly  to the skin and wash . Gently wash with scrungie or clean wascloth    5.  Apply the CHG Soap to your body ONLY FROM THE NECK DOWN.   Do not use on open                           Wound or open sores. Avoid contact with eyes, ears mouth and genitals (private parts).                        Genitals (private parts) with your normal soap.  6.  Wash thoroughly, paying special attention to the area where your surgery  will be performed.   7.  Thoroughly rinse your body with warm water from the neck down.   8.  DO NOT shower/wash with your normal soap after using and rinsing off  the CHG Soap .                9.  Pat yourself dry with a clean towel.             10.  Wear clean night clothes to bed after shower             11.  Place clean sheets on your bed the night of your first shower and do not  sleep with pets.  Day of Surgery : Do not apply any lotions/deodorants the morning of surgery.  Please wear clean clothes to the hospital/surgery center.  FAILURE TO FOLLOW THESE INSTRUCTIONS MAY RESULT IN THE CANCELLATION OF YOUR SURGERY    PATIENT SIGNATURE_________________________________  ______________________________________________________________________    WHAT IS A BLOOD TRANSFUSION? Blood Transfusion Information  A transfusion is the replacement of blood or some of its parts. Blood is made up of multiple cells which provide different functions.  Red blood cells carry oxygen and are used for blood loss replacement.  White blood cells fight against infection.  Platelets control bleeding.  Plasma helps clot  blood.  Other blood products are available for specialized needs, such as hemophilia or other clotting disorders. BEFORE THE TRANSFUSION  Who gives blood for transfusions?   Healthy volunteers who are fully evaluated to make sure their blood is safe. This is blood bank blood. Transfusion therapy is the safest it has ever been in the practice of medicine. Before blood is taken from a donor, a complete history is taken to make sure that person has no history of diseases nor engages in risky social behavior (examples are intravenous drug use or sexual activity with multiple partners). The donor's travel history is screened to minimize risk of transmitting infections, such as malaria. The donated blood is tested for signs of infectious diseases, such as HIV and hepatitis. The blood is then tested to be sure it is compatible with you in order to minimize the chance of a transfusion reaction. If you or a relative donates blood, this is often done in anticipation of surgery and is not appropriate for emergency situations. It takes many days to process the donated blood. RISKS AND COMPLICATIONS Although transfusion therapy is very safe and saves many lives, the main dangers of transfusion include:   Getting an infectious disease.  Developing a transfusion reaction. This is an allergic reaction to something in the blood you were given. Every precaution is taken to prevent this. The decision to have a blood transfusion has been considered carefully by your caregiver before blood is given. Blood is not given unless the benefits outweigh the risks. AFTER THE TRANSFUSION  Right after receiving a blood transfusion, you will usually feel much better and more energetic. This is especially true if your red blood cells have gotten low (anemic). The transfusion raises the level of the red blood cells which carry oxygen, and this usually causes an energy increase.  The nurse administering the transfusion will monitor  you carefully for complications. HOME CARE INSTRUCTIONS  No special instructions are needed after a transfusion. You may find your energy is better. Speak with your caregiver about any limitations on activity for underlying diseases you may have.  SEEK MEDICAL CARE IF:   Your condition is not improving after your transfusion.  You develop redness or irritation at the intravenous (IV) site. SEEK IMMEDIATE MEDICAL CARE IF:  Any of the following symptoms occur over the next 12 hours:  Shaking chills.  You have a temperature by mouth above 102 F (38.9 C), not controlled by medicine.  Chest, back, or muscle pain.  People around you feel you are not acting correctly or are confused.  Shortness of breath or difficulty breathing.  Dizziness and fainting.  You get a rash or develop hives.  You have a decrease in urine output.  Your urine turns a dark color or changes to pink, red, or brown. Any of the following symptoms occur over the next 10 days:  You have a temperature by mouth above 102 F (38.9 C), not controlled by medicine.  Shortness of breath.  Weakness after normal activity.  The white part of the eye turns yellow (jaundice).  You have a decrease in the amount of urine or are urinating less often.  Your urine turns a dark color or changes to pink, red, or brown. Document Released: 03/03/2000 Document Revised: 05/29/2011 Document Reviewed: 10/21/2007 [facility] Patient Information 2014 [place], [place].  _______________________________________________________________________

## 2015-02-17 ENCOUNTER — Other Ambulatory Visit: Payer: Self-pay | Admitting: Radiology

## 2015-02-18 ENCOUNTER — Ambulatory Visit (HOSPITAL_COMMUNITY): Payer: Medicare HMO | Admitting: Anesthesiology

## 2015-02-18 ENCOUNTER — Encounter (HOSPITAL_COMMUNITY)
Admission: RE | Disposition: A | Discharge status: Home / Self Care | Payer: Self-pay | Source: Ambulatory Visit | Attending: Urology

## 2015-02-18 ENCOUNTER — Ambulatory Visit (HOSPITAL_COMMUNITY)
Admission: RE | Admit: 2015-02-18 | Discharge: 2015-02-18 | Disposition: A | Discharge status: Home / Self Care | Payer: Medicare HMO | Source: Ambulatory Visit | Attending: Urology | Admitting: Urology

## 2015-02-18 ENCOUNTER — Encounter (HOSPITAL_COMMUNITY): Payer: Self-pay

## 2015-02-18 ENCOUNTER — Encounter (HOSPITAL_COMMUNITY): Payer: Self-pay | Admitting: *Deleted

## 2015-02-18 ENCOUNTER — Ambulatory Visit (HOSPITAL_COMMUNITY): Payer: Medicare HMO

## 2015-02-18 ENCOUNTER — Observation Stay (HOSPITAL_COMMUNITY)
Admission: RE | Admit: 2015-02-18 | Discharge: 2015-02-19 | Disposition: A | Discharge status: Home / Self Care | Payer: Medicare HMO | Source: Ambulatory Visit | Attending: Urology | Admitting: Urology

## 2015-02-18 ENCOUNTER — Encounter (HOSPITAL_COMMUNITY): Payer: Self-pay | Admitting: Anesthesiology

## 2015-02-18 DIAGNOSIS — Z79899 Other long term (current) drug therapy: Secondary | ICD-10-CM | POA: Diagnosis not present

## 2015-02-18 DIAGNOSIS — Z79891 Long term (current) use of opiate analgesic: Secondary | ICD-10-CM | POA: Diagnosis not present

## 2015-02-18 DIAGNOSIS — N400 Enlarged prostate without lower urinary tract symptoms: Secondary | ICD-10-CM | POA: Diagnosis not present

## 2015-02-18 DIAGNOSIS — Z87891 Personal history of nicotine dependence: Secondary | ICD-10-CM | POA: Diagnosis not present

## 2015-02-18 DIAGNOSIS — Z87442 Personal history of urinary calculi: Secondary | ICD-10-CM | POA: Insufficient documentation

## 2015-02-18 DIAGNOSIS — N200 Calculus of kidney: Secondary | ICD-10-CM | POA: Diagnosis not present

## 2015-02-18 DIAGNOSIS — N201 Calculus of ureter: Secondary | ICD-10-CM | POA: Diagnosis not present

## 2015-02-18 DIAGNOSIS — N132 Hydronephrosis with renal and ureteral calculous obstruction: Secondary | ICD-10-CM | POA: Diagnosis not present

## 2015-02-18 DIAGNOSIS — E039 Hypothyroidism, unspecified: Secondary | ICD-10-CM | POA: Diagnosis not present

## 2015-02-18 DIAGNOSIS — Z7982 Long term (current) use of aspirin: Secondary | ICD-10-CM | POA: Diagnosis not present

## 2015-02-18 DIAGNOSIS — N202 Calculus of kidney with calculus of ureter: Secondary | ICD-10-CM | POA: Diagnosis present

## 2015-02-18 HISTORY — PX: NEPHROLITHOTOMY: SHX5134

## 2015-02-18 LAB — TYPE AND SCREEN
ABO/RH(D): O POS
Antibody Screen: NEGATIVE

## 2015-02-18 LAB — CBC WITH DIFFERENTIAL/PLATELET
BASOS PCT: 0 %
Basophils Absolute: 0 10*3/uL (ref 0.0–0.1)
Eosinophils Absolute: 0.1 10*3/uL (ref 0.0–0.7)
Eosinophils Relative: 1 %
HEMATOCRIT: 42.3 % (ref 39.0–52.0)
HEMOGLOBIN: 14.4 g/dL (ref 13.0–17.0)
LYMPHS ABS: 0.6 10*3/uL — AB (ref 0.7–4.0)
Lymphocytes Relative: 14 %
MCH: 31.1 pg (ref 26.0–34.0)
MCHC: 34 g/dL (ref 30.0–36.0)
MCV: 91.4 fL (ref 78.0–100.0)
MONOS PCT: 13 %
Monocytes Absolute: 0.6 10*3/uL (ref 0.1–1.0)
NEUTROS ABS: 3.2 10*3/uL (ref 1.7–7.7)
NEUTROS PCT: 72 %
PLATELETS: 298 10*3/uL (ref 150–400)
RBC: 4.63 MIL/uL (ref 4.22–5.81)
RDW: 14.3 % (ref 11.5–15.5)
WBC: 4.5 10*3/uL (ref 4.0–10.5)

## 2015-02-18 LAB — PROTIME-INR
INR: 1.04 (ref 0.00–1.49)
PROTHROMBIN TIME: 13.8 s (ref 11.6–15.2)

## 2015-02-18 LAB — APTT: aPTT: 28 seconds (ref 24–37)

## 2015-02-18 SURGERY — NEPHROLITHOTOMY PERCUTANEOUS
Anesthesia: General | Laterality: Left

## 2015-02-18 MED ORDER — DOCUSATE SODIUM 100 MG PO CAPS
100.0000 mg | ORAL_CAPSULE | Freq: Two times a day (BID) | ORAL | Status: DC
  Administered 2015-02-18 – 2015-02-19 (×2): 100 mg via ORAL
  Filled 2015-02-18 (×2): qty 1

## 2015-02-18 MED ORDER — MIDAZOLAM HCL 2 MG/2ML IJ SOLN
INTRAMUSCULAR | Status: AC
  Filled 2015-02-18: qty 6

## 2015-02-18 MED ORDER — CIPROFLOXACIN IN D5W 400 MG/200ML IV SOLN
400.0000 mg | Freq: Once | INTRAVENOUS | Status: AC
  Administered 2015-02-18: 400 mg via INTRAVENOUS

## 2015-02-18 MED ORDER — FENTANYL CITRATE (PF) 100 MCG/2ML IJ SOLN
INTRAMUSCULAR | Status: AC | PRN
  Administered 2015-02-18: 50 ug via INTRAVENOUS
  Administered 2015-02-18: 25 ug via INTRAVENOUS

## 2015-02-18 MED ORDER — SULFAMETHOXAZOLE-TRIMETHOPRIM 800-160 MG PO TABS
1.0000 | ORAL_TABLET | Freq: Two times a day (BID) | ORAL | Status: DC
  Administered 2015-02-18 – 2015-02-19 (×2): 1 via ORAL
  Filled 2015-02-18 (×2): qty 1

## 2015-02-18 MED ORDER — IOHEXOL 300 MG/ML  SOLN
25.0000 mL | Freq: Once | INTRAMUSCULAR | Status: AC | PRN
  Administered 2015-02-18: 25 mL

## 2015-02-18 MED ORDER — SODIUM CHLORIDE 0.45 % IV SOLN
INTRAVENOUS | Status: DC
  Administered 2015-02-18 – 2015-02-19 (×2): via INTRAVENOUS

## 2015-02-18 MED ORDER — PROPOFOL 10 MG/ML IV BOLUS
INTRAVENOUS | Status: DC | PRN
  Administered 2015-02-18: 130 mg via INTRAVENOUS

## 2015-02-18 MED ORDER — FENTANYL CITRATE (PF) 250 MCG/5ML IJ SOLN
INTRAMUSCULAR | Status: AC
  Filled 2015-02-18: qty 5

## 2015-02-18 MED ORDER — HYDROMORPHONE HCL 1 MG/ML IJ SOLN: 0.5000 mg | INTRAMUSCULAR | Status: DC | PRN

## 2015-02-18 MED ORDER — PROMETHAZINE HCL 25 MG/ML IJ SOLN: 6.2500 mg | INTRAMUSCULAR | Status: DC | PRN

## 2015-02-18 MED ORDER — CEFAZOLIN SODIUM-DEXTROSE 2-3 GM-% IV SOLR
INTRAVENOUS | Status: AC
  Filled 2015-02-18: qty 50

## 2015-02-18 MED ORDER — SUGAMMADEX SODIUM 500 MG/5ML IV SOLN
INTRAVENOUS | Status: AC
  Filled 2015-02-18: qty 5

## 2015-02-18 MED ORDER — FENTANYL CITRATE (PF) 100 MCG/2ML IJ SOLN
INTRAMUSCULAR | Status: DC | PRN
  Administered 2015-02-18 (×2): 25 ug via INTRAVENOUS
  Administered 2015-02-18: 50 ug via INTRAVENOUS

## 2015-02-18 MED ORDER — OXYBUTYNIN CHLORIDE 5 MG PO TABS
5.0000 mg | ORAL_TABLET | Freq: Three times a day (TID) | ORAL | Status: DC | PRN
  Administered 2015-02-18: 5 mg via ORAL
  Filled 2015-02-18: qty 1

## 2015-02-18 MED ORDER — SULFAMETHOXAZOLE-TRIMETHOPRIM 800-160 MG PO TABS: 1.0000 | ORAL_TABLET | Freq: Two times a day (BID) | ORAL | Status: DC

## 2015-02-18 MED ORDER — LACTATED RINGERS IV SOLN: INTRAVENOUS | Status: DC

## 2015-02-18 MED ORDER — ONDANSETRON HCL 4 MG/2ML IJ SOLN
INTRAMUSCULAR | Status: DC | PRN
  Administered 2015-02-18: 4 mg via INTRAVENOUS

## 2015-02-18 MED ORDER — OXYCODONE HCL 5 MG PO TABS
5.0000 mg | ORAL_TABLET | ORAL | Status: DC | PRN
  Administered 2015-02-18 (×2): 5 mg via ORAL
  Filled 2015-02-18 (×2): qty 1

## 2015-02-18 MED ORDER — GLYCOPYRROLATE 0.2 MG/ML IJ SOLN
INTRAMUSCULAR | Status: DC | PRN
Start: 2015-02-18 — End: 2015-02-18
  Administered 2015-02-18: 0.2 mg via INTRAVENOUS

## 2015-02-18 MED ORDER — SODIUM CHLORIDE 0.9 % IR SOLN
Status: DC | PRN
  Administered 2015-02-18: 6000 mL

## 2015-02-18 MED ORDER — POLYVINYL ALCOHOL 1.4 % OP SOLN: 1.0000 [drp] | Freq: Two times a day (BID) | OPHTHALMIC | Status: DC | PRN

## 2015-02-18 MED ORDER — LIDOCAINE HCL 1 % IJ SOLN
INTRAMUSCULAR | Status: AC
  Filled 2015-02-18: qty 20

## 2015-02-18 MED ORDER — EPHEDRINE SULFATE 50 MG/ML IJ SOLN
INTRAMUSCULAR | Status: DC | PRN
  Administered 2015-02-18 (×2): 10 mg via INTRAVENOUS

## 2015-02-18 MED ORDER — ROCURONIUM BROMIDE 100 MG/10ML IV SOLN
INTRAVENOUS | Status: DC | PRN
  Administered 2015-02-18: 10 mg via INTRAVENOUS
  Administered 2015-02-18: 40 mg via INTRAVENOUS

## 2015-02-18 MED ORDER — CEFAZOLIN SODIUM-DEXTROSE 2-3 GM-% IV SOLR: 2.0000 g | INTRAVENOUS | Status: DC

## 2015-02-18 MED ORDER — ZOLPIDEM TARTRATE 5 MG PO TABS: 5.0000 mg | ORAL_TABLET | Freq: Every evening | ORAL | Status: DC | PRN

## 2015-02-18 MED ORDER — SODIUM CHLORIDE 0.9 % IV SOLN
INTRAVENOUS | Status: DC
  Administered 2015-02-18: 08:00:00 via INTRAVENOUS

## 2015-02-18 MED ORDER — ACETAMINOPHEN 325 MG PO TABS: 650.0000 mg | ORAL_TABLET | ORAL | Status: DC | PRN

## 2015-02-18 MED ORDER — DEXAMETHASONE SODIUM PHOSPHATE 10 MG/ML IJ SOLN
INTRAMUSCULAR | Status: DC | PRN
  Administered 2015-02-18: 10 mg via INTRAVENOUS

## 2015-02-18 MED ORDER — ONDANSETRON HCL 4 MG/2ML IJ SOLN
INTRAMUSCULAR | Status: AC
  Filled 2015-02-18: qty 2

## 2015-02-18 MED ORDER — IOHEXOL 300 MG/ML  SOLN
INTRAMUSCULAR | Status: DC | PRN
  Administered 2015-02-18: 30 mL via URETHRAL

## 2015-02-18 MED ORDER — LIDOCAINE HCL (CARDIAC) 20 MG/ML IV SOLN
INTRAVENOUS | Status: DC | PRN
  Administered 2015-02-18: 50 mg via INTRAVENOUS

## 2015-02-18 MED ORDER — DEXAMETHASONE SODIUM PHOSPHATE 10 MG/ML IJ SOLN
INTRAMUSCULAR | Status: AC
  Filled 2015-02-18: qty 1

## 2015-02-18 MED ORDER — SUGAMMADEX SODIUM 500 MG/5ML IV SOLN
INTRAVENOUS | Status: DC | PRN
  Administered 2015-02-18: 400 mg via INTRAVENOUS

## 2015-02-18 MED ORDER — FENTANYL CITRATE (PF) 100 MCG/2ML IJ SOLN
INTRAMUSCULAR | Status: AC
  Filled 2015-02-18: qty 4

## 2015-02-18 MED ORDER — CIPROFLOXACIN IN D5W 400 MG/200ML IV SOLN
INTRAVENOUS | Status: AC
  Filled 2015-02-18: qty 200

## 2015-02-18 MED ORDER — FENTANYL CITRATE (PF) 100 MCG/2ML IJ SOLN: 25.0000 ug | INTRAMUSCULAR | Status: DC | PRN

## 2015-02-18 MED ORDER — MIDAZOLAM HCL 2 MG/2ML IJ SOLN
INTRAMUSCULAR | Status: AC | PRN
  Administered 2015-02-18 (×4): 1 mg via INTRAVENOUS

## 2015-02-18 MED ORDER — LACTATED RINGERS IV SOLN
INTRAVENOUS | Status: DC | PRN
  Administered 2015-02-18 (×2): via INTRAVENOUS

## 2015-02-18 SURGICAL SUPPLY — 53 items
APL SKNCLS STERI-STRIP NONHPOA (GAUZE/BANDAGES/DRESSINGS) ×1
BAG URINE DRAINAGE (UROLOGICAL SUPPLIES) ×2 IMPLANT
BASKET ZERO TIP NITINOL 2.4FR (BASKET) ×1 IMPLANT
BENZOIN TINCTURE PRP APPL 2/3 (GAUZE/BANDAGES/DRESSINGS) ×3 IMPLANT
BLADE SURG 15 STRL LF DISP TIS (BLADE) ×1 IMPLANT
BLADE SURG 15 STRL SS (BLADE) ×2
BSKT STON RTRVL ZERO TP 2.4FR (BASKET)
CARTRIDGE STONEBREAK CO2 KIDNE (ELECTROSURGICAL) IMPLANT
CATCHER STONE W/TUBE ADAPTER (UROLOGICAL SUPPLIES) ×1 IMPLANT
CATH FOLEY 2W COUNCIL 20FR 5CC (CATHETERS) IMPLANT
CATH MULTI PURPOSE 16FR DRAIN (STENTS) ×1 IMPLANT
CATH ROBINSON RED A/P 20FR (CATHETERS) IMPLANT
CATH X-FORCE N30 NEPHROSTOMY (TUBING) ×2 IMPLANT
COVER SURGICAL LIGHT HANDLE (MISCELLANEOUS) ×1 IMPLANT
DRAPE C-ARM 42X120 X-RAY (DRAPES) ×2 IMPLANT
DRAPE CAMERA CLOSED 9X96 (DRAPES) ×1 IMPLANT
DRAPE LINGEMAN PERC (DRAPES) ×2 IMPLANT
DRAPE SURG IRRIG POUCH 19X23 (DRAPES) ×2 IMPLANT
DRSG PAD ABDOMINAL 8X10 ST (GAUZE/BANDAGES/DRESSINGS) ×2 IMPLANT
DRSG TEGADERM 8X12 (GAUZE/BANDAGES/DRESSINGS) ×4 IMPLANT
FIBER LASER FLEXIVA 1000 (UROLOGICAL SUPPLIES) IMPLANT
FIBER LASER FLEXIVA 200 (UROLOGICAL SUPPLIES) IMPLANT
FIBER LASER FLEXIVA 365 (UROLOGICAL SUPPLIES) IMPLANT
FIBER LASER FLEXIVA 550 (UROLOGICAL SUPPLIES) IMPLANT
FIBER LASER TRAC TIP (UROLOGICAL SUPPLIES) IMPLANT
GAUZE SPONGE 4X4 12PLY STRL (GAUZE/BANDAGES/DRESSINGS) ×1 IMPLANT
GLOVE BIOGEL M 8.0 STRL (GLOVE) ×7 IMPLANT
GOWN STRL REUS W/TWL XL LVL3 (GOWN DISPOSABLE) ×4 IMPLANT
GUIDEWIRE AMPLAZ .035X145 (WIRE) ×2 IMPLANT
GUIDEWIRE STR DUAL SENSOR (WIRE) ×1 IMPLANT
KIT BASIN OR (CUSTOM PROCEDURE TRAY) ×2 IMPLANT
MANIFOLD NEPTUNE II (INSTRUMENTS) ×2 IMPLANT
NS IRRIG 1000ML POUR BTL (IV SOLUTION) ×2 IMPLANT
PACK BASIC VI WITH GOWN DISP (CUSTOM PROCEDURE TRAY) ×1 IMPLANT
PACK CYSTO (CUSTOM PROCEDURE TRAY) ×2 IMPLANT
PROBE KIDNEY STONEBRKR 2.0X425 (ELECTROSURGICAL) ×1 IMPLANT
PROBE LITHOCLAST ULTRA 3.8X403 (UROLOGICAL SUPPLIES) IMPLANT
PROBE PNEUMATIC 1.0MMX570MM (UROLOGICAL SUPPLIES) ×1 IMPLANT
SET IRRIG Y TYPE TUR BLADDER L (SET/KITS/TRAYS/PACK) ×2 IMPLANT
SET WARMING FLUID IRRIGATION (MISCELLANEOUS) ×1 IMPLANT
SHEATH COOK PEEL AWAY SET 9F (SHEATH) ×1 IMPLANT
SPONGE LAP 4X18 X RAY DECT (DISPOSABLE) ×2 IMPLANT
STONE CATCHER W/TUBE ADAPTER (UROLOGICAL SUPPLIES) ×2 IMPLANT
SUT SILK 2 0 30  PSL (SUTURE)
SUT SILK 2 0 30 PSL (SUTURE) ×1 IMPLANT
SYR 20CC LL (SYRINGE) ×4 IMPLANT
SYRINGE 10CC LL (SYRINGE) ×2 IMPLANT
TRAY FOLEY BAG SILVER LF 14FR (CATHETERS) ×1 IMPLANT
TRAY FOLEY W/METER SILVER 14FR (SET/KITS/TRAYS/PACK) ×1 IMPLANT
TRAY FOLEY W/METER SILVER 16FR (SET/KITS/TRAYS/PACK) ×2 IMPLANT
TUBE CONNECTING VINYL 14FR 30C (MISCELLANEOUS) ×1 IMPLANT
TUBING CONNECTING 10 (TUBING) ×6 IMPLANT
WATER STERILE IRR 1500ML POUR (IV SOLUTION) ×1 IMPLANT

## 2015-02-18 NOTE — H&P (Signed)
Urology History and Physical Exam  CC: kidney stones  HPI: 69 year old male presents today for percutaneous management of a 13 mm left UVJ and a 10 mm left lower pole renal calculus.  PMH: Past Medical History  Diagnosis Date  . Nephrolithiasis   . BPH (benign prostatic hypertrophy)   . Diastolic dysfunction [id number]    Per echo. Ejection fraction 60-65%.  . Hydronephrosis of left kidney 12/13/2011  . History of skin cancer   . Bumps on skin     painful bump on left cheek  . History of transfusion   . Hypothyroidism     "stopped taking thyroid med when I turned 65, the free clinic kicked me out"  . Cancer ([place])     hx skin cancer    PSH: Past Surgical History  Procedure Laterality Date  . Cystoscopy prostate w/ laser    . Transthoracic echocardiogram  12/11/2011    EF 60-65%. Right ventricle dilatation.  . Cystoscopy w/ ureteroscopy w/ lithotripsy  12/12/2011    And insertion of double-J stent on the left.  [name] extraction with basket  12/12/2011    Procedure: STONE EXTRACTION WITH BASKET;  Surgeon: Marissa Nestle, MD;  Location: [facility];  Service: Urology;  Laterality: Left;  . Transurethral resection of prostate  2012  . Extracorporeal shock wave lithotripsy  01/03/2012    Procedure: EXTRACORPOREAL SHOCK WAVE LITHOTRIPSY (ESWL);  Surgeon: Marissa Nestle, MD;  Location: [facility];  Service: Urology;  Laterality: Left;  ESWL Left Renal Calculus  . Cystoscopy w/ ureteral stent placement  01/30/2012    Procedure: CYSTOSCOPY WITH RETROGRADE PYELOGRAM/URETERAL STENT PLACEMENT;  Surgeon: Marissa Nestle, MD;  Location: [facility];  Service: Urology;  Laterality: Left;  . Stone extraction with basket  01/30/2012    Procedure: STONE EXTRACTION WITH BASKET;  Surgeon: Marissa Nestle, MD;  Location: [facility];  Service: Urology;  Laterality: Left;  . Urethral stricture dilatation      Allergies: No Known Allergies  Medications: Prescriptions prior to admission  Medication Sig  Dispense Refill Last Dose  . aspirin EC 325 MG tablet Take 325 mg by mouth daily as needed for mild pain.   Past Week at Unknown time  . MAGNESIUM PO Take 1 tablet by mouth every Monday, Wednesday, and Friday.   Past Month at Unknown time  . Multiple Vitamin (MULTIVITAMIN WITH MINERALS) TABS tablet Take 1 tablet by mouth daily.   Past Month at Unknown time  . HYDROcodone-acetaminophen (NORCO/VICODIN) 5-325 MG tablet Take 2 tablets by mouth every 4 (four) hours as needed. 20 tablet 0 More than a month at Unknown time  . polyvinyl alcohol (LIQUIFILM TEARS) 1.4 % ophthalmic solution Place 1 drop into both eyes 2 (two) times daily as needed for dry eyes.   Unknown at Unknown time     Social History: Social History   Social History  . Marital Status: Divorced    Spouse Name: N/A  . Number of Children: N/A  . Years of Education: N/A   Occupational History  . Not on file.   Social History Main Topics  . Smoking status: Former Smoker -- 0.00 packs/day for 20 years    Types: Cigarettes    Quit date: 02/09/1982  . Smokeless tobacco: Former Systems developer    Quit date: 12/27/1981  . Alcohol Use: Yes     Comment: 1-2 drinks daily   . Drug Use: No  . Sexual Activity: Yes    Birth Control/  Protection: None   Other Topics Concern  . Not on file   Social History Narrative    Family History: History reviewed. No pertinent family history.  Review of Systems: Genitourinary, constitutional, skin, eye, otolaryngeal, hematologic/lymphatic, cardiovascular, pulmonary, endocrine, musculoskeletal, gastrointestinal, neurological and psychiatric system(s) were reviewed and pertinent findings if present are noted and are otherwise negative.  Genitourinary: incontinence, weak urinary stream, incomplete emptying of bladder and initiating urination requires straining.  Gastrointestinal: nausea and flank pain.     Physical Exam: @VITALS2 @ General: No acute distress.  Awake. Head:  Normocephalic.   Atraumatic. ENT:  EOMI.  Mucous membranes moist Neck:  Supple.  No lymphadenopathy. CV:  S1 present. S2 present. Regular rate. Pulmonary: Equal effort bilaterally.  Clear to auscultation bilaterally. Abdomen: Soft.  none tender to palpation. Skin:  Normal turgor.  No visible rash. Extremity: No gross deformity of bilateral upper extremities.  No gross deformity of                             lower extremities. Neurologic: Alert. Appropriate mood.    Studies:  Recent Labs     02/18/15  0800  HGB  14.4  WBC  4.5  PLT  298    No results for input(s): NA, K, CL, CO2, BUN, CREATININE, CALCIUM, GFRNONAA, GFRAA in the last 72 hours.  Invalid input(s): MAGNESIUM   Recent Labs     02/18/15  0800  INR  1.04  APTT  28     Invalid input(s): ABG    Assessment:  Left UPJ and left lower pole renal calculi and a hydronephrotic system  Plan: Percutaneous management of left UPJ and left lower pole stones

## 2015-02-18 NOTE — Anesthesia Preprocedure Evaluation (Addendum)
Anesthesia Evaluation  Patient identified by MRN, date of birth, ID band Patient awake    Reviewed: Allergy & Precautions, NPO status , Patient's Chart, lab work & pertinent test results  Airway Mallampati: II  TM Distance: >3 FB Neck ROM: Full    Dental no notable dental hx.    Pulmonary former smoker,    Pulmonary exam normal breath sounds clear to auscultation       Cardiovascular Normal cardiovascular exam Rhythm:Regular Rate:Normal  Diastolic dysfunction   Neuro/Psych negative neurological ROS  negative psych ROS   GI/Hepatic negative GI ROS, Neg liver ROS,   Endo/Other  Hypothyroidism   Renal/GU Renal disease  negative genitourinary   Musculoskeletal negative musculoskeletal ROS (+)   Abdominal   Peds negative pediatric ROS (+)  Hematology  (+) anemia ,   Anesthesia Other Findings   Reproductive/Obstetrics negative OB ROS                             Anesthesia Physical Anesthesia Plan  ASA: II  Anesthesia Plan: General   Post-op Pain Management:    Induction: Intravenous  Airway Management Planned: Oral ETT  Additional Equipment:   Intra-op Plan:   Post-operative Plan: Extubation in OR  Informed Consent: I have reviewed the patients History and Physical, chart, labs and discussed the procedure including the risks, benefits and alternatives for the proposed anesthesia with the patient or authorized representative who has indicated his/her understanding and acceptance.   Dental advisory given  Plan Discussed with: CRNA  Anesthesia Plan Comments:        Anesthesia Quick Evaluation

## 2015-02-18 NOTE — H&P (Signed)
Chief Complaint: Patient was seen in consultation today for left ureteral and renal calculus at the request of [place]  Referring Physician(s): Dahlstedt,Stephen  History of Present Illness: Anthony Glass is a 69 y.o. male with history of nephrolithiasis in the past with previous lithotripsy. He is here today with recently found left ureteral and renal calculus causing hydronephrosis and has been seen by Dr. Diona Fanti. He is scheduled today for image guided left percutaneous nephrostomy tube placement with PCNL to follow in OR. He denies any gross hematuria, but does admit to left flank pressure. He denies any chest pain, shortness of breath or palpitations. He denies any active signs of bleeding or excessive bruising. He denies any recent fever or chills. The patient denies any history of sleep apnea or chronic oxygen use. He has no known complications to sedation.    Past Medical History  Diagnosis Date  . Nephrolithiasis   . BPH (benign prostatic hypertrophy)   . Diastolic dysfunction [id number]    Per echo. Ejection fraction 60-65%.  . Hydronephrosis of left kidney 12/13/2011  . History of skin cancer   . Bumps on skin     painful bump on left cheek  . History of transfusion   . Hypothyroidism     "stopped taking thyroid med when I turned 65, the free clinic kicked me out"  . Cancer ([place])     hx skin cancer    Past Surgical History  Procedure Laterality Date  . Cystoscopy prostate w/ laser    . Transthoracic echocardiogram  12/11/2011    EF 60-65%. Right ventricle dilatation.  . Cystoscopy w/ ureteroscopy w/ lithotripsy  12/12/2011    And insertion of double-J stent on the left.  [name] extraction with basket  12/12/2011    Procedure: STONE EXTRACTION WITH BASKET;  Surgeon: Marissa Nestle, MD;  Location: [facility];  Service: Urology;  Laterality: Left;  . Transurethral resection of prostate  2012  . Extracorporeal shock wave lithotripsy  01/03/2012   Procedure: EXTRACORPOREAL SHOCK WAVE LITHOTRIPSY (ESWL);  Surgeon: Marissa Nestle, MD;  Location: [facility];  Service: Urology;  Laterality: Left;  ESWL Left Renal Calculus  . Cystoscopy w/ ureteral stent placement  01/30/2012    Procedure: CYSTOSCOPY WITH RETROGRADE PYELOGRAM/URETERAL STENT PLACEMENT;  Surgeon: Marissa Nestle, MD;  Location: [facility];  Service: Urology;  Laterality: Left;  . Stone extraction with basket  01/30/2012    Procedure: STONE EXTRACTION WITH BASKET;  Surgeon: Marissa Nestle, MD;  Location: [facility];  Service: Urology;  Laterality: Left;  . Urethral stricture dilatation      Allergies: Review of patient's allergies indicates no known allergies.  Medications: Prior to Admission medications   Medication Sig Start Date End Date Taking? Authorizing Provider  aspirin EC 325 MG tablet Take 325 mg by mouth daily as needed for mild pain.    Historical Provider, MD  HYDROcodone-acetaminophen (NORCO/VICODIN) 5-325 MG tablet Take 2 tablets by mouth every 4 (four) hours as needed. 12/26/14   Harvel Quale, MD  MAGNESIUM PO Take 1 tablet by mouth every Monday, Wednesday, and Friday.    Historical Provider, MD  Multiple Vitamin (MULTIVITAMIN WITH MINERALS) TABS tablet Take 1 tablet by mouth daily.    Historical Provider, MD  polyvinyl alcohol (LIQUIFILM TEARS) 1.4 % ophthalmic solution Place 1 drop into both eyes 2 (two) times daily as needed for dry eyes.    Historical Provider, MD     History reviewed. No pertinent  family history.  Social History   Social History  . Marital Status: Divorced    Spouse Name: N/A  . Number of Children: N/A  . Years of Education: N/A   Social History Main Topics  . Smoking status: Former Smoker -- 0.00 packs/day for 20 years    Types: Cigarettes    Quit date: 02/09/1982  . Smokeless tobacco: Former Systems developer    Quit date: 12/27/1981  . Alcohol Use: Yes     Comment: 1-2 drinks daily   . Drug Use: No  . Sexual Activity: Yes    Birth  Control/ Protection: None   Other Topics Concern  . None   Social History Narrative   Review of Systems: A 12 point ROS discussed and pertinent positives are indicated in the HPI above.  All other systems are negative.  Review of Systems  Vital Signs: T: 97.60F, HR: 63 bpm, BP: 143/99 mmHg, O2: 96% RA  Physical Exam  Constitutional: He is oriented to person, place, and time. No distress.  HENT:  Head: Normocephalic and atraumatic.  Cardiovascular: Normal rate and regular rhythm.  Exam reveals no gallop and no friction rub.   No murmur heard. Pulmonary/Chest: Effort normal and breath sounds normal. No respiratory distress. He has no wheezes. He has no rales.  Abdominal: Soft. Bowel sounds are normal. He exhibits no distension. There is no tenderness.  Neurological: He is alert and oriented to person, place, and time.  Skin: Skin is warm and dry. He is not diaphoretic.    Mallampati Score:  MD Evaluation Airway: WNL Heart: WNL Abdomen: WNL Chest/ Lungs: WNL ASA  Classification: 2 Mallampati/Airway Score: Two  Imaging: No results found.  Labs:  CBC:  Recent Labs  12/08/14 1745 12/26/14 1433 02/10/15 1510 02/18/15 0800  WBC 5.5 5.7 5.8 4.5  HGB 13.9 14.1 13.2 14.4  HCT 41.0 40.9 39.1 42.3  PLT 269 231 270 298    COAGS:  Recent Labs  02/18/15 0800  INR 1.04  APTT 28    BMP:  Recent Labs  12/08/14 1745 12/26/14 1433 02/10/15 1510  NA 137 136 137  K 4.1 4.2 4.1  CL 101 105 105  CO2 29 27 24   GLUCOSE 90 96 84  BUN 16 24* 22*  CALCIUM 9.6 9.6 9.2  CREATININE 0.94 1.07 1.01  GFRNONAA >60 >60 >60  GFRAA >60 >60 >60    LIVER FUNCTION TESTS:  Recent Labs  12/26/14 1433  BILITOT 0.6  AST 27  ALT 21  ALKPHOS 57  PROT 6.5  ALBUMIN 3.8    Assessment and Plan: Left ureteral and renal calculus Seen by Dr. Diona Fanti Scheduled today for image guided left percutaneous nephrostomy tube placement with PCNL to follow in OR The patient has been  NPO, no blood thinners taken, labs and vitals have been reviewed. Risks and Benefits discussed with the patient including, but not limited to infection, bleeding, significant bleeding causing loss or decrease in renal function or damage to adjacent structures.  All of the patient's questions were answered, patient is agreeable to proceed. Consent signed and in chart.   Thank you for this interesting consult.  I greatly enjoyed meeting Anthony Glass and look forward to participating in their care.  A copy of this report was sent to the requesting provider on this date.  SignedHedy Jacob 02/18/2015, 8:49 AM   I spent a total of 15 Minutes in face to face in clinical consultation, greater than 50% of which was  counseling/coordinating care for left ureteral and renal calculus

## 2015-02-18 NOTE — Transfer of Care (Signed)
Immediate Anesthesia Transfer of Care Note  Patient: Anthony Glass  Procedure(s) Performed: Procedure(s): LEFT PERCUTANEOUS NEPHROLITHOTOMY   (Left)  Patient Location: PACU  Anesthesia Type:General  Level of Consciousness:  sedated, patient cooperative and responds to stimulation  Airway & Oxygen Therapy:Patient Spontanous Breathing and Patient connected to face mask oxgen  Post-op Assessment:  Report given to PACU RN and Post -op Vital signs reviewed and stable  Post vital signs:  Reviewed and stable  Last Vitals:  Filed Vitals:   02/18/15 1144 02/18/15 1145  BP:    Pulse: 50 52  Temp:    Resp:      Complications: No apparent anesthesia complications

## 2015-02-18 NOTE — Discharge Instructions (Signed)

## 2015-02-18 NOTE — Op Note (Signed)
Preoperative diagnosis: Left UPJ and left lower pole calculi  Postoperative diagnosis: Same  Principal procedure: Left percutaneous nephrolithotomy of a a 13 mm and 10 mm left UPJ and left lower pole stone, placement of percutaneous nephrostomy tube, antegrade nephrogram with interpretive fluoroscopy  Surgeon: Karima Carrell  Anesthesia: Gen. with endotracheal tube  Complications: None  Drains: 78 French Foley catheter, 34 French left percutaneous nephrostomy tube-Cook pigtail catheter  Specimens: 2 stones, to the patient  Indications: 69 year old male with large left UPJ and lower pole stones. The patient does have mild hydronephrosis from the UPJ stone. We discussed treatment options-lithotripsy, percutaneous nephrolithotomy versus ureteroscopy. As the stones have an aggregate diameter of 23 mm, it was recommended that he undergo percutaneous management, as this will most likely remove all the stone burden with 1 treatment. Risks and complications have been discussed with the patient. He understands these, and desires to proceed.  The patient has had lower pole renal access percutaneously performed by Dr. Markus Daft of interventional radiology.  Procedure: The patient was properly identified in the holding area. He had received intravenous Cipro in the interventional radiology suite. His left side was properly marked. He was then taken the operating room where general endotracheal anesthetic was administered. His bladder was drained with a 16 French indwelling Foley catheter. He was then placed in the prone position. All extremities were padded appropriately. PAS hose were placed. The left flank was then prepped and draped around the previously placed Kumpe catheter. Timeout was then performed.  I then advanced a working guidewire through the Kumpe catheter, and advanced into the bladder with fluoroscopic guidance. The Kumpe catheter was then removed, and I then placed a 10 French peel-away sheath  over top of the working guidewire once an incision was made around the guidewire. The inner core the peel-away sheath was removed, and I eventually advanced a sensor-tip guidewire into the bladder as well using fluoroscopic guidance. The peel-away sheath was then removed. I then dilated the nephrostomy tract with a NephroMax balloon up to a pressure of 18 atm. I then placed a 28 French nephrostomy access sheath over top of the balloon into the lower pole calyx. The balloon was then removed. The working guidewire and safety wire were in place. I then advanced the rigid nephroscope through the access sheath. Both stones were encountered at the UPJ. They were both removed without the need for fragmentation. I then used the flexible scope to identify all calyces. No further stones were seen. Fairly large clots were evacuated by grasping them and removing them. After identification and removal of the only 2 stones, I then removed the scope. The Cantwell pigtail catheter was advanced over the working guidewire up to the UPJ. Contrast was injected to identify proper placement of the distal tip of the pigtail catheter. I then deployed the pigtail by pulling on the string. This produced a nice curl in the renal pelvis. The access sheath was then removed, leaving the pigtail catheter in place. I then passed contrast again. This provided opacification of the entire collecting system. No filling defects were seen and there was no extravasation. There was a minimal amount of contrasted progressed into the proximal ureter which appeared normal. Having remove the working guidewire already, I then removed the safety guidewire. The small percutaneous incision was closed using 2-0 silk in a vertical mattress fashion in 2 areas. The pigtail catheter was then secured to the skin separately. Dry sterile dressing was placed. I then had the  pigtail catheter hooked up to the bladder drainage. There patient tolerated procedure well.  He was taken to the PACU in stable condition after he was extubated. There was minimal blood loss.

## 2015-02-18 NOTE — Procedures (Signed)
Post-Procedure Note  Pre-operative Diagnosis: Left renal calculi       Post-operative Diagnosis: Mobile left renal calculi   Indications:   Scheduled for percutaneous nephrolithotomy procedure.  Procedure Details:  Left nephrostogram and placement of nephroureteral catheter via lower pole calyx.  Findings: Two mobile stones.  Mild hydronephrosis.    Complications: None     Condition: stable  Plan: OR later today.

## 2015-02-18 NOTE — Anesthesia Postprocedure Evaluation (Signed)
Anesthesia Post Note  Patient: Anthony Glass  Procedure(s) Performed: Procedure(s) (LRB): LEFT PERCUTANEOUS NEPHROLITHOTOMY   (Left)  Patient location during evaluation: PACU Anesthesia Type: General Level of consciousness: awake and alert Pain management: pain level controlled Vital Signs Assessment: post-procedure vital signs reviewed and stable Respiratory status: spontaneous breathing, nonlabored ventilation, respiratory function stable and patient connected to nasal cannula oxygen Cardiovascular status: blood pressure returned to baseline and stable Postop Assessment: no signs of nausea or vomiting Anesthetic complications: no    Last Vitals:  Filed Vitals:   02/18/15 1423 02/18/15 1612  BP: 119/82 132/83  Pulse: 59 61  Temp: 36.4 C 36.4 C  Resp: 12 16    Last Pain:  Filed Vitals:   02/18/15 1815  PainSc: 4                  Tomekia Helton J

## 2015-02-18 NOTE — Anesthesia Procedure Notes (Signed)
Procedure Name: Intubation Date/Time: 02/18/2015 12:12 PM Performed by: Lynel Forester, Virgel Gess Pre-anesthesia Checklist: Patient identified, Emergency Drugs available, Suction available, Patient being monitored and Timeout performed Patient Re-evaluated:Patient Re-evaluated prior to inductionOxygen Delivery Method: Circle system utilized Preoxygenation: Pre-oxygenation with 100% oxygen Intubation Type: IV induction Ventilation: Mask ventilation without difficulty Laryngoscope Size: Mac and 4 Grade View: Grade I Tube type: Oral Tube size: 7.5 mm Number of attempts: 1 Airway Equipment and Method: Stylet Placement Confirmation: ETT inserted through vocal cords under direct vision,  positive ETCO2,  CO2 detector and breath sounds checked- equal and bilateral Secured at: 23 cm Tube secured with: Tape Dental Injury: Teeth and Oropharynx as per pre-operative assessment

## 2015-02-19 DIAGNOSIS — N132 Hydronephrosis with renal and ureteral calculous obstruction: Secondary | ICD-10-CM | POA: Diagnosis not present

## 2015-02-19 NOTE — Discharge Summary (Signed)
Patient ID: Anthony Glass MRN: [id number] DOB/AGE: 1945-06-04 69 y.o.  Admit date: 02/18/2015 Discharge date: 02/19/2015  Primary Care Physician:  PROVIDER NOT IN SYSTEM  Discharge Diagnoses:  Left renal calculi   Consults:  None   Discharge Med ications:   Medication List    TAKE these medications        aspirin EC 325 MG tablet  Take 325 mg by mouth daily as needed for mild pain.     HYDROcodone-acetaminophen 5-325 MG tablet  Commonly known as:  NORCO/VICODIN  Take 2 tablets by mouth every 4 (four) hours as needed.     MAGNESIUM PO  Take 1 tablet by mouth every Monday, Wednesday, and Friday.     multivitamin with minerals Tabs tablet  Take 1 tablet by mouth daily.     polyvinyl alcohol 1.4 % ophthalmic solution  Commonly known as:  LIQUIFILM TEARS  Place 1 drop into both eyes 2 (two) times daily as needed for dry eyes.     sulfamethoxazole-trimethoprim 800-160 MG tablet  Commonly known as:  BACTRIM DS,SEPTRA DS  Take 1 tablet by mouth 2 (two) times daily.         Significant Diagnostic Studies:  Dg C-arm 1-60 Min-no Report  02/18/2015  CLINICAL DATA: surgery C-ARM 1-60 MINUTES Fluoroscopy was utilized by the requesting physician.  No radiographic interpretation.   Ir Ureteral Stent Left New Access W/o Sep Nephrostomy Cath  02/18/2015  CLINICAL DATA:  69 year old with left renal calculi and scheduled for nephrolithotomy procedure. EXAM: PLACEMENT OF A LEFT NEPHROURETERAL CATHETER WITH ULTRASOUND AND FLUOROSCOPIC GUIDANCE Physician: Stephan Minister. Anselm Pancoast, MD FLUOROSCOPY TIME:  238 mGy MEDICATIONS: 4 mg Versed, 75 mcg fentanyl. A radiology nurse monitored the patient for moderate sedation. 400 mg ciprofloxacin. Ciprofloxacin was given within two hours of incision. ANESTHESIA/SEDATION: Moderate sedation time: 30 minutes PROCEDURE: The procedure was explained to the patient. The risks and benefits of the procedure were discussed and the patient's questions were addressed.  Informed consent was obtained from the patient. Patient was placed prone on the interventional table. Left kidney was identified with ultrasound. The left flank skin was marked. The left flank was prepped and draped in a sterile fashion. Maximal barrier sterile technique was utilized including caps, mask, sterile gowns, sterile gloves, sterile drape, hand hygiene and skin antiseptic. Skin was anesthetized with 1% lidocaine. A dilated calyx in the mid/upper pole region was targeted with ultrasound guidance. A 22 gauge Chiba needle was directed into the dilated calyx with ultrasound guidance. Contrast was used to opacify the renal collecting system. Two large stones in a lower pole calyx were identified and targeted with fluoroscopy. A second 22 gauge needle was directed onto the lower pole calyx with fluoroscopic guidance. Needle position was confirmed within the calyx and a 0.018 wire was advanced into the renal collecting system. The upper pole access needle was removed. An Accustick dilator set was placed in the lower pole access. Bentson wire was advanced into the ureter and a 5 French catheter was advanced over the wire and positioned in the urinary bladder. Catheter was sutured to the skin. Bandage was placed over the catheter. FINDINGS: Two large stones were initially in a lower pole calyx. Patient has a bifid collecting system. During placement of the catheter, the stones moved into the renal pelvis. At the end of the procedure, the stones appeared to migrate into the upper pole moiety. Estimated blood loss: Minimal COMPLICATIONS: None IMPRESSION: Successful placement of a left nephroureteral catheter with ultrasound  and fluoroscopic guidance. Percutaneous access was obtained from a lower pole calyx. The left renal calculi are mobile. Electronically Signed   By: Markus Daft M.D.   On: 02/18/2015 11:47    Brief H and P: For complete details please refer to admission H and P, but in brief  the patient was  admitted for management of moderate-sized left renal calculi.  Hospital course:  Active Problems:   Renal calculus  the patient had an uncomplicated postoperative course. His nephrostomy tube was eventually clamped and, several hours after tolerating this clamp, it was removed. He was discharged on postoperative day #1.   Day of Discharge BP 119/81 mmHg  Pulse 61  Temp(Src) 97.9 F (36.6 C) (Oral)  Resp 18  Ht 6\' 1"  (1.854 m)  Wt 83.632 kg (184 lb 6 oz)  BMI 24.33 kg/m2  SpO2 96%  Results for orders placed or performed during the hospital encounter of 02/18/15 (from the past 24 hour(s))  APTT     Status: None   Collection Time: 02/18/15  8:00 AM  Result Value Ref Range   aPTT 28 24 - 37 seconds  CBC with Differential/Platelet     Status: Abnormal   Collection Time: 02/18/15  8:00 AM  Result Value Ref Range   WBC 4.5 4.0 - 10.5 K/uL   RBC 4.63 4.22 - 5.81 MIL/uL   Hemoglobin 14.4 13.0 - 17.0 g/dL   HCT 42.3 39.0 - 52.0 %   MCV 91.4 78.0 - 100.0 fL   MCH 31.1 26.0 - 34.0 pg   MCHC 34.0 30.0 - 36.0 g/dL   RDW 14.3 11.5 - 15.5 %   Platelets 298 150 - 400 K/uL   Neutrophils Relative % 72 %   Neutro Abs 3.2 1.7 - 7.7 K/uL   Lymphocytes Relative 14 %   Lymphs Abs 0.6 (L) 0.7 - 4.0 K/uL   Monocytes Relative 13 %   Monocytes Absolute 0.6 0.1 - 1.0 K/uL   Eosinophils Relative 1 %   Eosinophils Absolute 0.1 0.0 - 0.7 K/uL   Basophils Relative 0 %   Basophils Absolute 0.0 0.0 - 0.1 K/uL  Protime-INR     Status: None   Collection Time: 02/18/15  8:00 AM  Result Value Ref Range   Prothrombin Time 13.8 11.6 - 15.2 seconds   INR 1.04 0.00 - 1.49    Physical Exam: General: Alert and awake oriented x3 not in any acute distress. HEENT: anicteric sclera, pupils reactive to light and accommodation CVS: S1-S2 clear no murmur rubs or gallops Chest: clear to auscultation bilaterally, no wheezing rales or rhonchi Abdomen: soft nontender, nondistended, normal bowel sounds, no  organomegaly Extremities: no cyanosis, clubbing or edema noted bilaterally Neuro: Cranial nerves II-XII intact, no focal neurological deficits   disposition: Home  Activity:  no restrictions   Disposition and Follow-up:    Follow-up in office as scheduledHAT NEED FOLLOW-UP  Eventual stone analysis  DISCHARGE FOLLOW-UP   Time spent on Disch15 minutes   Signed: Jorja Loa 02/19/2015, 6:51 AM

## 2015-02-19 NOTE — Progress Notes (Signed)
Nephrostomy tube clamped at 0500 per MD order. Instructed patient to report any pressure or back pain around the tube. Patient understood and had no questions or concerns. Will continue to monitor.

## 2015-02-19 NOTE — Progress Notes (Signed)
Pt began to feel pressure around nephrostomy tube, unclamped tube. Will pass onto next shift and continue to monitor output.

## 2015-02-19 NOTE — Progress Notes (Signed)
Pt given discharge instructions and all questions answered. IV discontinued and catheter was intact. Patient called family for ride home. Prescriptions sent to pharmacy. Dressing applied by MD to old nephrostomy site.

## 2015-02-19 NOTE — Progress Notes (Signed)
1 Day Post-Op Subjective: Patient reports minimal pain. He did not tolerate clamping of his nephrostomy tube.  Objective: Vital signs in last 24 hours: Temp:  [97.4 F (36.3 C)-97.9 F (36.6 C)] 97.9 F (36.6 C) (12/02 0543) Pulse Rate:  [48-84] 61 (12/02 0543) Resp:  [10-18] 18 (12/02 0543) BP: (113-155)/(62-106) 119/81 mmHg (12/02 0543) SpO2:  [96 %-100 %] 96 % (12/02 0543) Weight:  [83.632 kg (184 lb 6 oz)] 83.632 kg (184 lb 6 oz) (12/01 1427)  Intake/Output from previous day: 12/01 0701 - 12/02 0700 In: 2531.3 [P.O.:240; I.V.:2291.3] Out: 2975 [Urine:2950; Blood:25] Intake/Output this shift:    Physical Exam:  Constitutional: Vital signs reviewed. WD WN in NAD   Eyes: PERRL, No scleral icterus.   Pulmonary/Chest: Normal effort   Lab Results:  Recent Labs  02/18/15 0800  HGB 14.4  HCT 42.3   BMET No results for input(s): NA, K, CL, CO2, GLUCOSE, BUN, CREATININE, CALCIUM in the last 72 hours.  Recent Labs  02/18/15 0800  INR 1.04   No results for input(s): LABURIN in the last 72 hours. Results for orders placed or performed during the hospital encounter of 12/26/14  Urine culture     Status: None   Collection Time: 12/26/14  2:35 PM  Result Value Ref Range Status   Specimen Description URINE, CLEAN CATCH  Final   Special Requests NONE  Final   Culture   Final    MULTIPLE SPECIES PRESENT, SUGGEST RECOLLECTION Performed at [facility]    Report Status 12/28/2014 FINAL  Final    Studies/Results: Dg C-arm 1-60 Min-no Report  02/18/2015  CLINICAL DATA: surgery C-ARM 1-60 MINUTES Fluoroscopy was utilized by the requesting physician.  No radiographic interpretation.   Ir Ureteral Stent Left New Access W/o Sep Nephrostomy Cath  02/18/2015  CLINICAL DATA:  69 year old with left renal calculi and scheduled for nephrolithotomy procedure. EXAM: PLACEMENT OF A LEFT NEPHROURETERAL CATHETER WITH ULTRASOUND AND FLUOROSCOPIC GUIDANCE Physician: Stephan Minister. Anselm Pancoast,  MD FLUOROSCOPY TIME:  238 mGy MEDICATIONS: 4 mg Versed, 75 mcg fentanyl. A radiology nurse monitored the patient for moderate sedation. 400 mg ciprofloxacin. Ciprofloxacin was given within two hours of incision. ANESTHESIA/SEDATION: Moderate sedation time: 30 minutes PROCEDURE: The procedure was explained to the patient. The risks and benefits of the procedure were discussed and the patient's questions were addressed. Informed consent was obtained from the patient. Patient was placed prone on the interventional table. Left kidney was identified with ultrasound. The left flank skin was marked. The left flank was prepped and draped in a sterile fashion. Maximal barrier sterile technique was utilized including caps, mask, sterile gowns, sterile gloves, sterile drape, hand hygiene and skin antiseptic. Skin was anesthetized with 1% lidocaine. A dilated calyx in the mid/upper pole region was targeted with ultrasound guidance. A 22 gauge Chiba needle was directed into the dilated calyx with ultrasound guidance. Contrast was used to opacify the renal collecting system. Two large stones in a lower pole calyx were identified and targeted with fluoroscopy. A second 22 gauge needle was directed onto the lower pole calyx with fluoroscopic guidance. Needle position was confirmed within the calyx and a 0.018 wire was advanced into the renal collecting system. The upper pole access needle was removed. An Accustick dilator set was placed in the lower pole access. Bentson wire was advanced into the ureter and a 5 French catheter was advanced over the wire and positioned in the urinary bladder. Catheter was sutured to the skin. Bandage was placed  over the catheter. FINDINGS: Two large stones were initially in a lower pole calyx. Patient has a bifid collecting system. During placement of the catheter, the stones moved into the renal pelvis. At the end of the procedure, the stones appeared to migrate into the upper pole moiety.  Estimated blood loss: Minimal COMPLICATIONS: None IMPRESSION: Successful placement of a left nephroureteral catheter with ultrasound and fluoroscopic guidance. Percutaneous access was obtained from a lower pole calyx. The left renal calculi are mobile. Electronically Signed   By: Markus Daft M.D.   On: 02/18/2015 11:47    Assessment/Plan:   Postoperative day #1 left percutaneous nephrolithotomy. He has not yet tolerated clamping of this nephrostomy tube.    We will try again later an approximate 5 hours. If he doesn't tolerate that for tube removal, I will leave him overnight, as he has little social support.     Jorja Loa 02/19/2015, 7:58 AM

## 2015-03-09 ENCOUNTER — Ambulatory Visit (INDEPENDENT_AMBULATORY_CARE_PROVIDER_SITE_OTHER): Payer: Self-pay | Admitting: Urology

## 2015-03-09 DIAGNOSIS — N200 Calculus of kidney: Secondary | ICD-10-CM | POA: Diagnosis not present

## 2015-04-26 DIAGNOSIS — N200 Calculus of kidney: Secondary | ICD-10-CM | POA: Diagnosis not present

## 2015-06-08 ENCOUNTER — Ambulatory Visit (INDEPENDENT_AMBULATORY_CARE_PROVIDER_SITE_OTHER): Payer: Medicare HMO | Admitting: Family Medicine

## 2015-06-08 ENCOUNTER — Encounter: Payer: Self-pay | Admitting: Family Medicine

## 2015-06-08 VITALS — BP 132/90 | Ht 73.0 in | Wt 186.0 lb

## 2015-06-08 DIAGNOSIS — Z79899 Other long term (current) drug therapy: Secondary | ICD-10-CM

## 2015-06-08 DIAGNOSIS — Z125 Encounter for screening for malignant neoplasm of prostate: Secondary | ICD-10-CM

## 2015-06-08 DIAGNOSIS — L570 Actinic keratosis: Secondary | ICD-10-CM

## 2015-06-08 DIAGNOSIS — H01009 Unspecified blepharitis unspecified eye, unspecified eyelid: Secondary | ICD-10-CM

## 2015-06-08 DIAGNOSIS — Z13220 Encounter for screening for lipoid disorders: Secondary | ICD-10-CM

## 2015-06-08 MED ORDER — SULFACETAMIDE SODIUM 10 % OP SOLN: OPHTHALMIC | Status: DC

## 2015-06-08 NOTE — Progress Notes (Signed)
   Subjective:    Patient ID: Anthony Glass, male    DOB: Jul 03, 1945, 70 y.o.   MRN: [id number]  patient arrives office as a new patient. Rash This is a new problem. The current episode started more than 1 year ago. The problem is unchanged. The affected locations include the face. The rash is characterized by pain. He was exposed to nothing. Past treatments include nothing. The treatment provided no relief.   Patient states that he has discharge coming from his eyes and he would like to have something prescribed for that.  Both eyes. Crusty nature irritated off-and-on. Given erythromycin  Gel by optometrist did notdid not help much  Aspirin daily , 250 vit  Smoked up til  About thirty yrs ago,    Concerned about a small area on his left cheek. Worried that it might be skin cancer. Would like to see a dermatologist daily. Next  History of kidney stones. No recent flare.   patient is a retired Therapist, [employer]  Skin: Positive for rash.   headache no chest pain no abdominal pain     Objective:   Physical Exam   alert vital stable HEENT bilateral blepharitis. Left cheek actinic keratosis neck supple lungs clear. Heart regular in rhythm. Blood pressure good on repeat      Assessment & Plan:   impression 1 probable actinic keratosis with patient 1 mL dermatologist #2 blepharitis management discussed #3 patient has not had a primary healthcare physician for quite some time swimming to work on catching up on something #4 history kidney stones plan appropriate blood work. Recheck in 2 months dermatology referral [facility]

## 2015-06-09 ENCOUNTER — Encounter: Payer: Self-pay | Admitting: Family Medicine

## 2015-06-21 DIAGNOSIS — D225 Melanocytic nevi of trunk: Secondary | ICD-10-CM | POA: Diagnosis not present

## 2015-06-21 DIAGNOSIS — D485 Neoplasm of uncertain behavior of skin: Secondary | ICD-10-CM | POA: Diagnosis not present

## 2015-06-21 DIAGNOSIS — L570 Actinic keratosis: Secondary | ICD-10-CM | POA: Diagnosis not present

## 2015-06-21 DIAGNOSIS — X32XXXA Exposure to sunlight, initial encounter: Secondary | ICD-10-CM | POA: Diagnosis not present

## 2015-07-24 DIAGNOSIS — Z13220 Encounter for screening for lipoid disorders: Secondary | ICD-10-CM | POA: Diagnosis not present

## 2015-07-24 DIAGNOSIS — Z125 Encounter for screening for malignant neoplasm of prostate: Secondary | ICD-10-CM | POA: Diagnosis not present

## 2015-07-24 DIAGNOSIS — Z79899 Other long term (current) drug therapy: Secondary | ICD-10-CM | POA: Diagnosis not present

## 2015-07-27 LAB — PSA: PROSTATE SPECIFIC AG, SERUM: 0.4 ng/mL (ref 0.0–4.0)

## 2015-07-27 LAB — BASIC METABOLIC PANEL
BUN/Creatinine Ratio: 25 — ABNORMAL HIGH (ref 10–24)
BUN: 24 mg/dL (ref 8–27)
CALCIUM: 9.2 mg/dL (ref 8.6–10.2)
CHLORIDE: 103 mmol/L (ref 96–106)
CO2: 21 mmol/L (ref 18–29)
Creatinine, Ser: 0.96 mg/dL (ref 0.76–1.27)
GFR calc Af Amer: 92 mL/min/{1.73_m2} (ref >59)
GFR calc non Af Amer: 80 mL/min/{1.73_m2} (ref >59)
GLUCOSE: 92 mg/dL (ref 65–99)
Potassium: 4.8 mmol/L (ref 3.5–5.2)
Sodium: 141 mmol/L (ref 134–144)

## 2015-07-27 LAB — LIPID PANEL
CHOL/HDL RATIO: 3.7 ratio (ref 0.0–5.0)
Cholesterol, Total: 236 mg/dL — ABNORMAL HIGH (ref 100–199)
HDL: 64 mg/dL (ref >39)
LDL CALC: 160 mg/dL — AB (ref 0–99)
TRIGLYCERIDES: 60 mg/dL (ref 0–149)
VLDL CHOLESTEROL CAL: 12 mg/dL (ref 5–40)

## 2015-07-27 LAB — HEPATIC FUNCTION PANEL
ALT: 18 IU/L (ref 0–44)
AST: 19 IU/L (ref 0–40)
Albumin: 4.2 g/dL (ref 3.5–4.8)
Alkaline Phosphatase: 66 IU/L (ref 39–117)
Bilirubin Total: 0.4 mg/dL (ref 0.0–1.2)
Bilirubin, Direct: 0.15 mg/dL (ref 0.00–0.40)
Total Protein: 6.2 g/dL (ref 6.0–8.5)

## 2015-09-07 ENCOUNTER — Ambulatory Visit (INDEPENDENT_AMBULATORY_CARE_PROVIDER_SITE_OTHER): Payer: Medicare HMO | Admitting: Family Medicine

## 2015-09-07 ENCOUNTER — Encounter: Payer: Self-pay | Admitting: Family Medicine

## 2015-09-07 VITALS — BP 120/82 | Ht 73.0 in | Wt 182.2 lb

## 2015-09-07 DIAGNOSIS — E785 Hyperlipidemia, unspecified: Secondary | ICD-10-CM

## 2015-09-07 NOTE — Patient Instructions (Signed)
Results for orders placed or performed in visit on 06/08/15  Lipid panel  Result Value Ref Range   Cholesterol, Total 236 (H) 100 - 199 mg/dL   Triglycerides 60 0 - 149 mg/dL   HDL 64 >39 mg/dL   VLDL Cholesterol Cal 12 5 - 40 mg/dL   LDL Calculated 160 (H) 0 - 99 mg/dL   Chol/HDL Ratio 3.7 0.0 - 5.0 ratio units  Hepatic function panel  Result Value Ref Range   Total Protein 6.2 6.0 - 8.5 g/dL   Albumin 4.2 3.5 - 4.8 g/dL   Bilirubin Total 0.4 0.0 - 1.2 mg/dL   Bilirubin, Direct 0.15 0.00 - 0.40 mg/dL   Alkaline Phosphatase 66 39 - 117 IU/L   AST 19 0 - 40 IU/L   ALT 18 0 - 44 IU/L  Basic metabolic panel  Result Value Ref Range   Glucose 92 65 - 99 mg/dL   BUN 24 8 - 27 mg/dL   Creatinine, Ser 0.96 0.76 - 1.27 mg/dL   GFR calc non Af Amer 80 >59 mL/min/1.73   GFR calc Af Amer 92 >59 mL/min/1.73   BUN/Creatinine Ratio 25 (H) 10 - 24   Sodium 141 134 - 144 mmol/L   Potassium 4.8 3.5 - 5.2 mmol/L   Chloride 103 96 - 106 mmol/L   CO2 21 18 - 29 mmol/L   Calcium 9.2 8.6 - 10.2 mg/dL  PSA  Result Value Ref Range   Prostate Specific Ag, Serum 0.4 0.0 - 4.0 ng/mL

## 2015-09-07 NOTE — Progress Notes (Signed)
   Subjective:    Patient ID: Anthony Glass, male    DOB: 03-01-1946, 70 y.o.   MRN: [id number] Patient arrives office for protracted discussion HPI Patient in today for 2 month follow up on Actinic Keratosis of left cheek.  Last had labs drawn on 07/24/2015. States overall the rash is improved. States he did not particularly like 43 dermatologist who he saw  Exercising regularly. Gets in a lot of walking.  Not particularly interested in having a physical. Discussed.  States no concerns this visit.   Review of Systems No headache, no major weight loss or weight gain, no chest pain no back pain abdominal pain no change in bowel habits complete ROS otherwise negative     Objective:   Physical Exam  Alert no acute distress. Lungs clear. Heart regular in rhythm. HEENT normal, skin lesion healing nicely      Assessment & Plan:  Impression rash clinically improved discussed #2 patient has periods in the past I suspect of some challenges, potentially mental health related, that he is not willing to share at this time. Very nice gentleman. I gently discussed all the potential preventative interventions out there for a gentleman of his age, vaccines colonoscopy wellness exam etc. and he declined. #3 hyperlipidemia and discuss LDL too high patient to work on it has cut down whole milk. Recheck in 6 months. Advised he can call us anytime with any concerns. 25 minutes spent most in discussion [facility]

## 2015-09-08 DIAGNOSIS — E785 Hyperlipidemia, unspecified: Secondary | ICD-10-CM | POA: Insufficient documentation

## 2015-12-08 ENCOUNTER — Ambulatory Visit (INDEPENDENT_AMBULATORY_CARE_PROVIDER_SITE_OTHER): Payer: Medicare HMO | Admitting: Family Medicine

## 2015-12-08 ENCOUNTER — Encounter: Payer: Self-pay | Admitting: Family Medicine

## 2015-12-08 VITALS — BP 122/90 | Temp 98.7°F | Ht 73.0 in | Wt 182.0 lb

## 2015-12-08 DIAGNOSIS — R0782 Intercostal pain: Secondary | ICD-10-CM

## 2015-12-08 DIAGNOSIS — Z23 Encounter for immunization: Secondary | ICD-10-CM

## 2015-12-08 NOTE — Patient Instructions (Addendum)
Take three aleave twice per day with a bit of food

## 2015-12-08 NOTE — Progress Notes (Signed)
   Subjective:    Patient ID: Anthony Glass, male    DOB: 12-21-1945, 70 y.o.   MRN: [id number]  HPIabdominal pain on right side, occasional nausea. Started 3 days ago. Taking aspirin.   Felt nauseated last going on since Monday  Worse when picks up   No obv feve  no sttrai or finding a comfortable position  Taking asirin prn  Worse in orn with nausea just slightly  flu   Patient is noting pain is sharp. Right-sided. Worse with certain motions. At times worse with a deep breath.  Results for orders placed or performed in visit on 06/08/15  Lipid panel  Result Value Ref Range   Cholesterol, Total 236 (H) 100 - 199 mg/dL   Triglycerides 60 0 - 149 mg/dL   HDL 64 >39 mg/dL   VLDL Cholesterol Cal 12 5 - 40 mg/dL   LDL Calculated 160 (H) 0 - 99 mg/dL   Chol/HDL Ratio 3.7 0.0 - 5.0 ratio units  Hepatic function panel  Result Value Ref Range   Total Protein 6.2 6.0 - 8.5 g/dL   Albumin 4.2 3.5 - 4.8 g/dL   Bilirubin Total 0.4 0.0 - 1.2 mg/dL   Bilirubin, Direct 0.15 0.00 - 0.40 mg/dL   Alkaline Phosphatase 66 39 - 117 IU/L   AST 19 0 - 40 IU/L   ALT 18 0 - 44 IU/L  Basic metabolic panel  Result Value Ref Range   Glucose 92 65 - 99 mg/dL   BUN 24 8 - 27 mg/dL   Creatinine, Ser 0.96 0.76 - 1.27 mg/dL   GFR calc non Af Amer 80 >59 mL/min/1.73   GFR calc Af Amer 92 >59 mL/min/1.73   BUN/Creatinine Ratio 25 (H) 10 - 24   Sodium 141 134 - 144 mmol/L   Potassium 4.8 3.5 - 5.2 mmol/L   Chloride 103 96 - 106 mmol/L   CO2 21 18 - 29 mmol/L   Calcium 9.2 8.6 - 10.2 mg/dL  PSA  Result Value Ref Range   Prostate Specific Ag, Serum 0.4 0.0 - 4.0 ng/mL     Review of Systems No headache, no major weight loss or weight gain, no chest pain no back pain abdominal pain no change in bowel habits complete ROS otherwise negative     Objective:   Physical Exam  Alert no acute distress lungs clear heart rare rhythm abdominal exam benign right lower costal margin and right distal  lower chest wall is tender diffusely without masses      Assessment & Plan:  Impression acute chest wall strain discussed plan hold off on increase testing at this time. Anti-inflammatory medicine. Expect gradual improvement warning signs discussed flu shot today

## 2016-03-02 ENCOUNTER — Other Ambulatory Visit: Payer: Self-pay | Admitting: Urology

## 2016-03-02 ENCOUNTER — Ambulatory Visit (HOSPITAL_COMMUNITY)
Admission: RE | Admit: 2016-03-02 | Discharge: 2016-03-02 | Disposition: A | Discharge status: Home / Self Care | Payer: Medicare HMO | Source: Ambulatory Visit | Attending: Urology | Admitting: Urology

## 2016-03-02 DIAGNOSIS — Z87442 Personal history of urinary calculi: Secondary | ICD-10-CM

## 2016-03-02 DIAGNOSIS — K5641 Fecal impaction: Secondary | ICD-10-CM | POA: Diagnosis not present

## 2016-03-02 DIAGNOSIS — N200 Calculus of kidney: Secondary | ICD-10-CM | POA: Diagnosis not present

## 2016-03-02 DIAGNOSIS — I998 Other disorder of circulatory system: Secondary | ICD-10-CM | POA: Diagnosis not present

## 2016-03-07 ENCOUNTER — Ambulatory Visit (INDEPENDENT_AMBULATORY_CARE_PROVIDER_SITE_OTHER): Payer: Medicare HMO | Admitting: Urology

## 2016-03-07 DIAGNOSIS — N200 Calculus of kidney: Secondary | ICD-10-CM | POA: Diagnosis not present

## 2016-03-08 ENCOUNTER — Ambulatory Visit: Payer: Medicare HMO | Admitting: Family Medicine

## 2016-05-30 ENCOUNTER — Ambulatory Visit (INDEPENDENT_AMBULATORY_CARE_PROVIDER_SITE_OTHER): Payer: Medicare HMO | Admitting: Family Medicine

## 2016-05-30 ENCOUNTER — Encounter: Payer: Self-pay | Admitting: Family Medicine

## 2016-05-30 VITALS — BP 112/80 | Ht 73.0 in | Wt 192.4 lb

## 2016-05-30 DIAGNOSIS — E785 Hyperlipidemia, unspecified: Secondary | ICD-10-CM

## 2016-05-30 DIAGNOSIS — Z79899 Other long term (current) drug therapy: Secondary | ICD-10-CM

## 2016-05-30 NOTE — Progress Notes (Signed)
   Subjective:    Patient ID: Anthony Glass, male    DOB: Aug 20, 1945, 71 y.o.   MRN: [id number]  Hyperlipidemia  This is a chronic problem. The current episode started more than 1 year ago. There are no known factors aggravating his hyperlipidemia. Current antihyperlipidemic treatment includes diet change. The current treatment provides moderate improvement of lipids. There are no compliance problems.  There are no known risk factors for coronary artery disease.   Patient has no concerns at this time.   BP quite good, tends to run overall good  Lip and liver     Review of Systems No headache, no major weight loss or weight gain, no chest pain no back pain abdominal pain no change in bowel habits complete ROS otherwise negative     Objective:   Physical Exam  Alert vitals stable, NAD. Blood pressure good on repeat. HEENT normal. Lungs clear. Heart regular rate and rhythm.       Assessment & Plan:  Impression hyperlipidemia discussed patient working on things no recent blood work plan check profile is still elevated we'll press on with medications discussed with patient Greater than 50% of this 15 minute face to face visit was spent in counseling and discussion and coordination of care regarding the above diagnosis/diagnosies

## 2016-10-07 DIAGNOSIS — Z79899 Other long term (current) drug therapy: Secondary | ICD-10-CM | POA: Diagnosis not present

## 2016-10-07 DIAGNOSIS — E785 Hyperlipidemia, unspecified: Secondary | ICD-10-CM | POA: Diagnosis not present

## 2016-10-08 LAB — LIPID PANEL
CHOL/HDL RATIO: 3.6 ratio (ref 0.0–5.0)
CHOLESTEROL TOTAL: 207 mg/dL — AB (ref 100–199)
HDL: 57 mg/dL (ref >39)
LDL CALC: 135 mg/dL — AB (ref 0–99)
Triglycerides: 77 mg/dL (ref 0–149)
VLDL CHOLESTEROL CAL: 15 mg/dL (ref 5–40)

## 2016-10-08 LAB — HEPATIC FUNCTION PANEL
ALBUMIN: 4.2 g/dL (ref 3.5–4.8)
ALT: 18 IU/L (ref 0–44)
AST: 24 IU/L (ref 0–40)
Alkaline Phosphatase: 71 IU/L (ref 39–117)
Bilirubin Total: 0.4 mg/dL (ref 0.0–1.2)
Bilirubin, Direct: 0.12 mg/dL (ref 0.00–0.40)
Total Protein: 6.3 g/dL (ref 6.0–8.5)

## 2016-10-11 ENCOUNTER — Encounter: Payer: Self-pay | Admitting: Family Medicine

## 2016-11-07 ENCOUNTER — Ambulatory Visit (INDEPENDENT_AMBULATORY_CARE_PROVIDER_SITE_OTHER): Payer: Medicare HMO | Admitting: Family Medicine

## 2016-11-07 ENCOUNTER — Encounter: Payer: Self-pay | Admitting: Family Medicine

## 2016-11-07 VITALS — BP 132/80 | HR 72 | Resp 14

## 2016-11-07 DIAGNOSIS — S61011A Laceration without foreign body of right thumb without damage to nail, initial encounter: Secondary | ICD-10-CM | POA: Diagnosis not present

## 2016-11-07 NOTE — Progress Notes (Signed)
   Subjective:    Patient ID: Anthony Glass, male    DOB: [birth year], 71 y.o.   MRN: [id number]  HPIpt cut left thumb with an utility knife today.   Up to date on tetanus shot. One done just 2 years ago.  Knife was fairly clean. Slice thumb.  Review of Systems No headache, no major weight loss or weight gain, no chest pain no back pain abdominal pain no change in bowel habits complete ROS otherwise negative     Objective:   Physical Exam 3 cm laceration across anterior right thumb next  Patient was prepped and anesthetized irrigated and repaired with 6 5-0 Ethilon sutures. Distal sensation intact.       Assessment & Plan:  Impression laceration wound care discussed follow-up in 8 or 9 days for removal laceration

## 2016-12-24 IMAGING — CT CT RENAL STONE PROTOCOL
2 of 4 series · 16 of 46 positions shown, 18 images · non-contrast
Comparison: 01/12/2012

CLINICAL DATA: Left flank pain, chills

EXAM:
CT ABDOMEN AND PELVIS WITHOUT CONTRAST
TECHNIQUE: Multidetector CT imaging of the abdomen and pelvis was performed
following the standard protocol without IV contrast.

[Series 2: standard/full over (age)lbs 5.0 · axial · 0.66mm/px · z∈[-507,-87]mm · 13 of 94 slices shown, 15 images]
[im 5/94  soft-tissue]
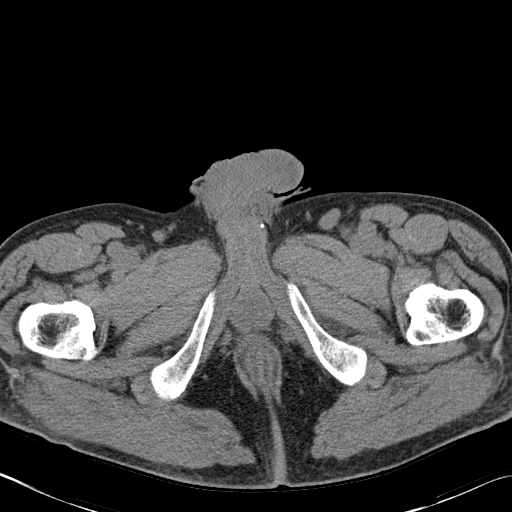
[im 5/94  bone]
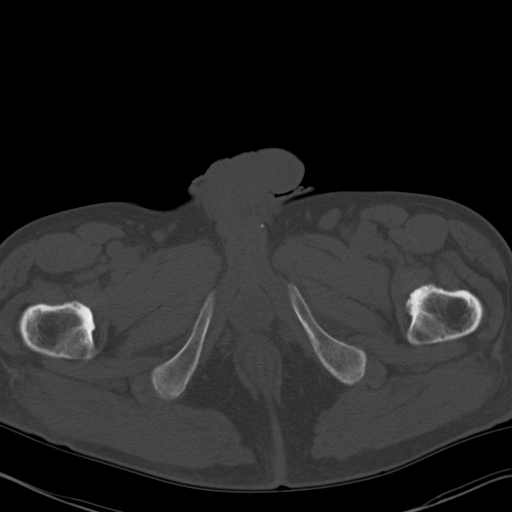
[im 13/94  soft-tissue]
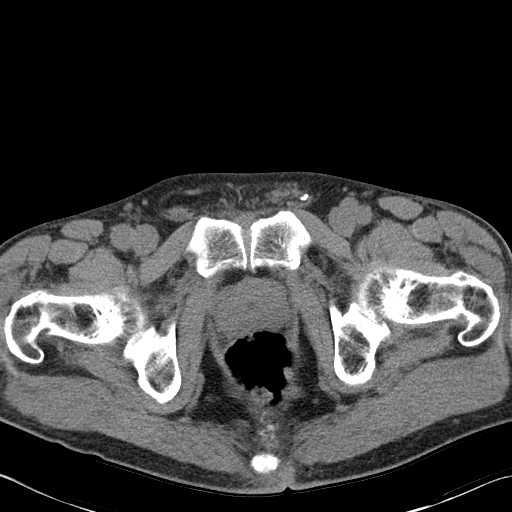
[im 21/94  soft-tissue]
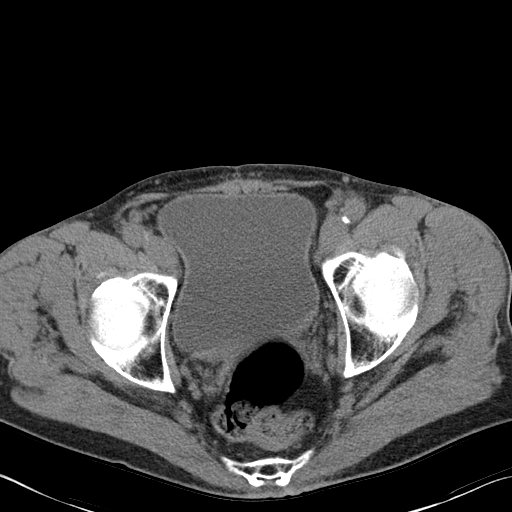
[im 25/94  soft-tissue]
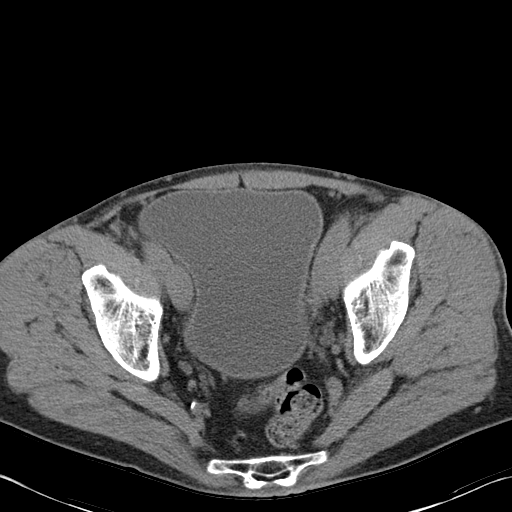
[im 33/94  soft-tissue]
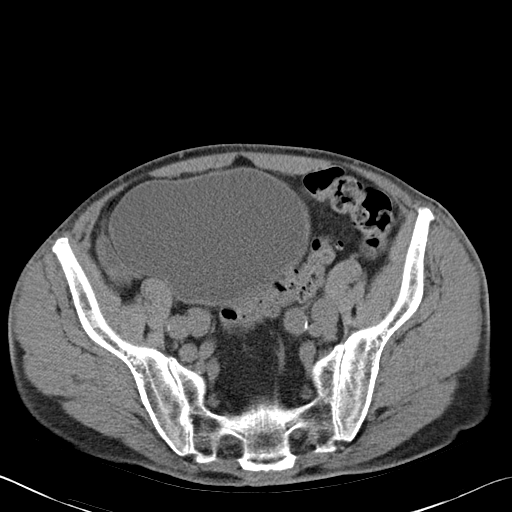
[im 41/94  soft-tissue]
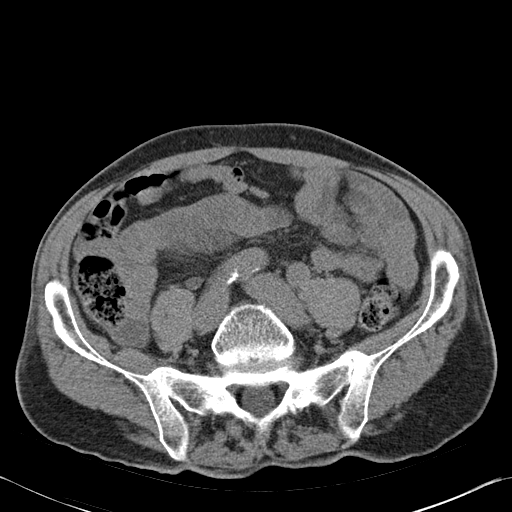
[im 49/94  soft-tissue]
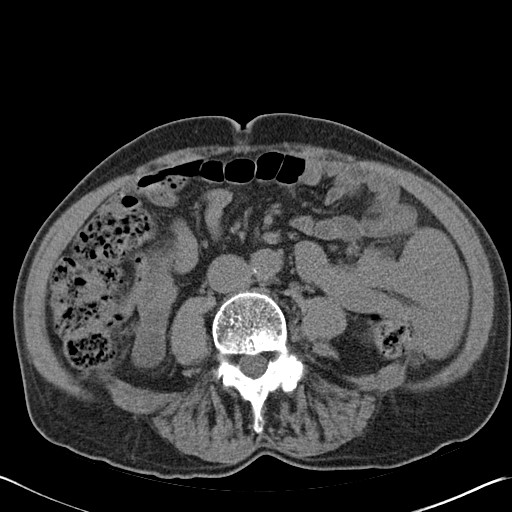
[im 53/94  soft-tissue]
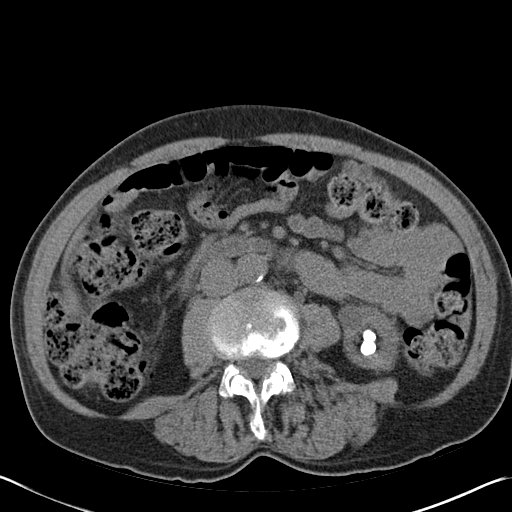
[im 61/94  soft-tissue]
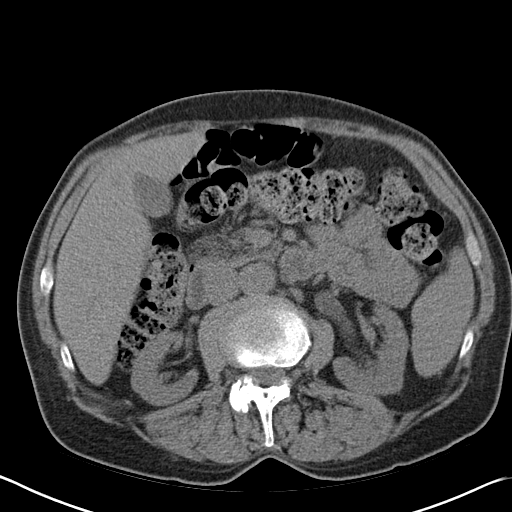
[im 61/94  bone]
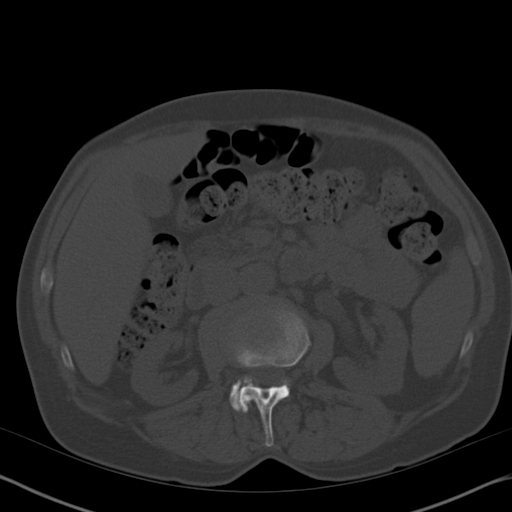
[im 69/94  soft-tissue]
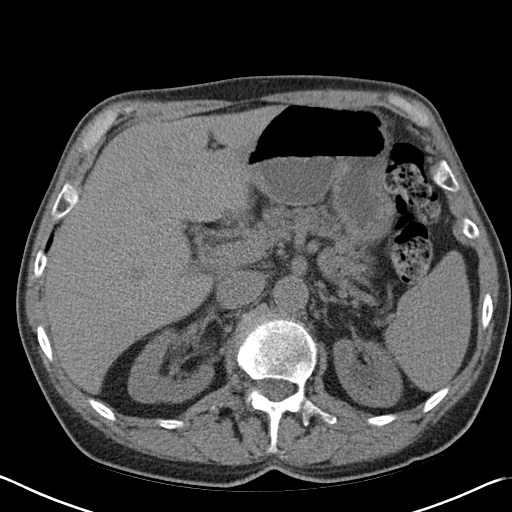
[im 73/94  soft-tissue]
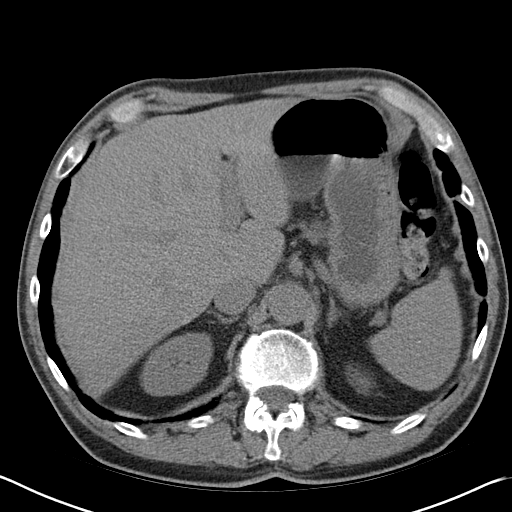
[im 81/94  soft-tissue]
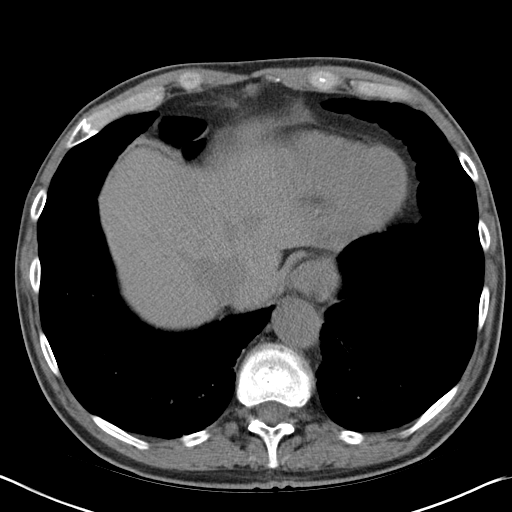
[im 89/94  soft-tissue]
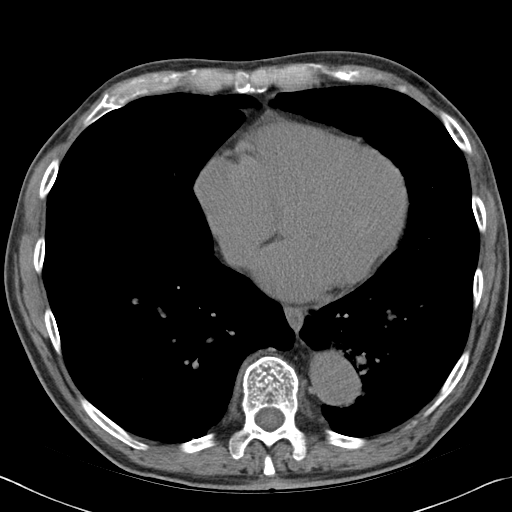

[Series 4: mpr coronal · coronal · 0.71mm/px · 3 of 95 slices shown]
[im 32/95  soft-tissue]
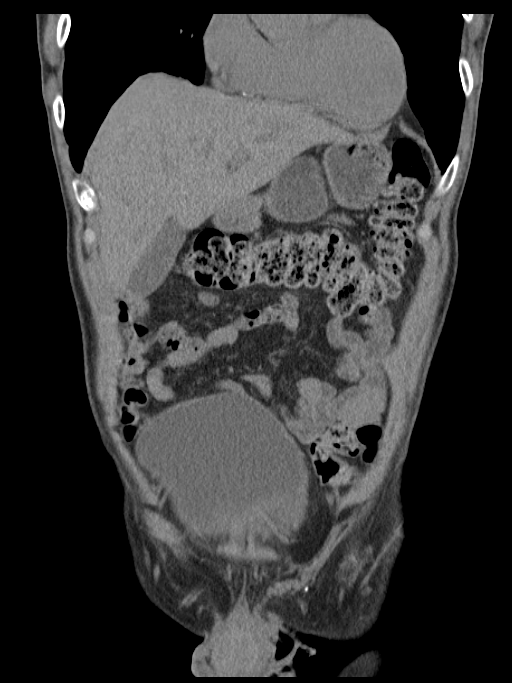
[im 42/95  soft-tissue]
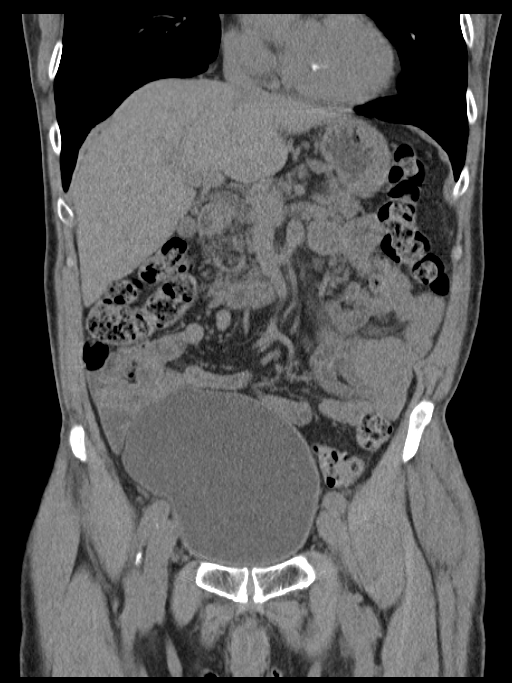
[im 53/95  soft-tissue]
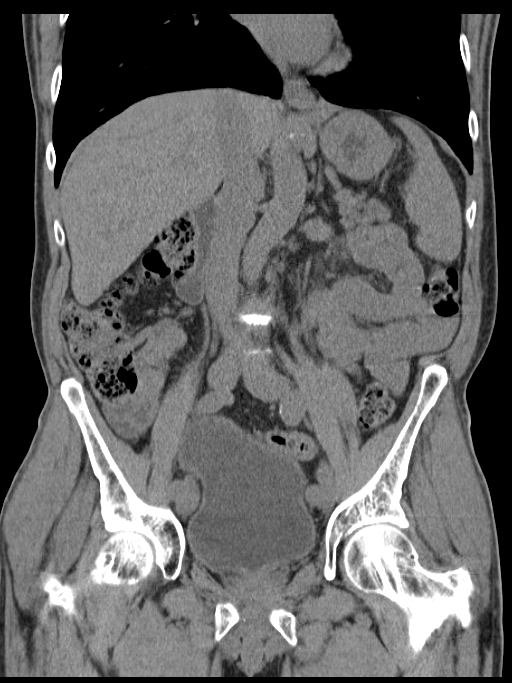

[16 of 46 positions shown; findings below may reference images not displayed]

FINDINGS: The lung bases are unremarkable. Sagittal images of the spine shows
degenerative changes thoracolumbar spine. Unenhanced liver shows no
biliary ductal dilatation. No calcified gallstones are noted within
gallbladder. Stool noted throughout the colon. Atherosclerotic
calcifications of abdominal aorta and iliac arteries. Unenhanced
pancreas, spleen and adrenal glands are unremarkable.

No aortic aneurysm.

No small bowel obstruction.  No pericecal inflammation.

At least 2 nonobstructive calcified calculi are noted in lower pole
of the left kidney the largest measures 7.5 mm. There is mild left
hydronephrosis and left hydroureter. No calcified left ureteral
calculi. Findings may be due to a recent passed left ureteral
calculus or left urinary tract inflammation. Clinical correlation is
necessary.

There is a distended urinary bladder. No calcified calculi are noted
within urinary bladder. TURP defect is noted posterior aspect of the
bladder. No distal colonic obstruction.
IMPRESSION: 1. There is left nonobstructive nephrolithiasis. Mild left
hydronephrosis and left hydroureter. No calcified ureteral calculi
are noted. Findings may be due to a recent passed left ureteral
calculus or left urinary tract inflammation/infection. No calcified
calculi are noted within urinary bladder.
2. There is a distended urinary bladder. Post TURP defect noted
posterior aspect of the bladder.
3. Stool noted throughout the colon.
4. No small bowel obstruction.

## 2018-03-06 ENCOUNTER — Ambulatory Visit (HOSPITAL_COMMUNITY)
Admission: RE | Admit: 2018-03-06 | Discharge: 2018-03-06 | Disposition: A | Discharge status: Home / Self Care | Payer: Medicare HMO | Source: Ambulatory Visit | Attending: Urology | Admitting: Urology

## 2018-03-06 ENCOUNTER — Other Ambulatory Visit: Payer: Self-pay | Admitting: Urology

## 2018-03-06 DIAGNOSIS — N200 Calculus of kidney: Secondary | ICD-10-CM

## 2018-03-06 DIAGNOSIS — Z09 Encounter for follow-up examination after completed treatment for conditions other than malignant neoplasm: Secondary | ICD-10-CM | POA: Diagnosis not present

## 2018-03-26 ENCOUNTER — Ambulatory Visit: Payer: Medicare HMO | Admitting: Urology

## 2018-03-26 DIAGNOSIS — N200 Calculus of kidney: Secondary | ICD-10-CM | POA: Diagnosis not present

## 2018-04-16 ENCOUNTER — Ambulatory Visit (INDEPENDENT_AMBULATORY_CARE_PROVIDER_SITE_OTHER): Payer: Medicare HMO | Admitting: Family Medicine

## 2018-04-16 ENCOUNTER — Encounter: Payer: Self-pay | Admitting: Family Medicine

## 2018-04-16 VITALS — BP 130/102 | Ht 73.0 in | Wt 190.0 lb

## 2018-04-16 DIAGNOSIS — E785 Hyperlipidemia, unspecified: Secondary | ICD-10-CM

## 2018-04-16 DIAGNOSIS — Z23 Encounter for immunization: Secondary | ICD-10-CM

## 2018-04-16 DIAGNOSIS — R5383 Other fatigue: Secondary | ICD-10-CM | POA: Diagnosis not present

## 2018-04-16 DIAGNOSIS — Z79899 Other long term (current) drug therapy: Secondary | ICD-10-CM | POA: Diagnosis not present

## 2018-04-16 DIAGNOSIS — I10 Essential (primary) hypertension: Secondary | ICD-10-CM

## 2018-04-16 DIAGNOSIS — Z125 Encounter for screening for malignant neoplasm of prostate: Secondary | ICD-10-CM | POA: Diagnosis not present

## 2018-04-16 MED ORDER — AMLODIPINE BESYLATE 5 MG PO TABS: ORAL_TABLET | ORAL | 5 refills | Status: DC

## 2018-04-16 NOTE — Progress Notes (Signed)
   Subjective:    Patient ID: Anthony Glass, male    DOB: Jun 25, 1945, 73 y.o.   MRN: [id number]  HPI Patient is here today to follow up on his chronic health issues.  He has a history of Hyperlipidemia, he is not on any medications.  He also has a history of high blood pressure and is not on any medications. He states he had it checked at another Dr's office Dr/[employer]) and it was elevated. He thought he may have to come in today to be placed on a medication to help lower it. Patient goes into fairly extensive history of his ongoing elevated blood pressures in recent months while interacting with his urologist.  And with multiple checks at [facility].  Numbers have been substantially elevated in the 668D often and systolic and 594 or higher often and diastolic  Patient also notes substantial fatigue progressive over recent months.  Leading to diminished activity level.  Claims no depression or anxiety  Review of Systems No headache, no major weight loss or weight gain, no chest pain no back pain abdominal pain no change in bowel habits complete ROS otherwise negative Alert and orient     Objective:   Physical Exam  Alert vitals stable, NAD. Blood pressure good on repeat. HEENT normal. Lungs clear. Heart regular rate and rhythm.  Alert and oriented, vitals reviewed and stable, NAD ENT-TM's and ext canals WNL bilat via otoscopic exam Soft palate, tonsils and post pharynx WNL via oropharyngeal exam Neck-symmetric, no masses; thyroid nonpalpable and nontender Pulmonary-no tachypnea or accessory muscle use; Clear without wheezes via auscultation Card--no abnrml murmurs, rhythm reg and rate WNL Carotid pulses symmetric, without bruits      Assessment & Plan:  Impression hypertension.  Review of blood pressures last several years have shown borderline unsatisfactory systolics and diastolics at times.  Positive family history of hypertension.  Patient exercises a lot.  Trying to  watch his salt intake pros and cons of initiating medications discussed.  Will initiate Norvasc 5 mg q. at bedtime.  Rationale discussed  2.  Hyperlipidemia.  Current status uncertain appropriate blood work  3.  Fatigue.  Nonspecific will check appropriate blood work.  Warning signs discussed   Follow-up in several months educational permission given  Greater than 50% of this 25 minute face to face visit was spent in counseling and discussion and coordination of care regarding the above diagnosis/diagnosies

## 2018-05-11 DIAGNOSIS — R5383 Other fatigue: Secondary | ICD-10-CM | POA: Diagnosis not present

## 2018-05-11 DIAGNOSIS — E785 Hyperlipidemia, unspecified: Secondary | ICD-10-CM | POA: Diagnosis not present

## 2018-05-11 DIAGNOSIS — Z79899 Other long term (current) drug therapy: Secondary | ICD-10-CM | POA: Diagnosis not present

## 2018-05-11 DIAGNOSIS — Z125 Encounter for screening for malignant neoplasm of prostate: Secondary | ICD-10-CM | POA: Diagnosis not present

## 2018-05-12 LAB — CBC WITH DIFFERENTIAL/PLATELET
BASOS: 1 %
Basophils Absolute: 0 10*3/uL (ref 0.0–0.2)
EOS (ABSOLUTE): 0.1 10*3/uL (ref 0.0–0.4)
Eos: 2 %
HEMATOCRIT: 44.7 % (ref 37.5–51.0)
HEMOGLOBIN: 15 g/dL (ref 13.0–17.7)
IMMATURE GRANS (ABS): 0 10*3/uL (ref 0.0–0.1)
IMMATURE GRANULOCYTES: 0 %
LYMPHS: 17 %
Lymphocytes Absolute: 1 10*3/uL (ref 0.7–3.1)
MCH: 30.9 pg (ref 26.6–33.0)
MCHC: 33.6 g/dL (ref 31.5–35.7)
MCV: 92 fL (ref 79–97)
MONOCYTES: 11 %
Monocytes Absolute: 0.6 10*3/uL (ref 0.1–0.9)
NEUTROS PCT: 69 %
Neutrophils Absolute: 4.1 10*3/uL (ref 1.4–7.0)
Platelets: 290 10*3/uL (ref 150–450)
RBC: 4.85 x10E6/uL (ref 4.14–5.80)
RDW: 13 % (ref 11.6–15.4)
WBC: 5.8 10*3/uL (ref 3.4–10.8)

## 2018-05-12 LAB — PSA: Prostate Specific Ag, Serum: 1.2 ng/mL (ref 0.0–4.0)

## 2018-05-12 LAB — LIPID PANEL
CHOL/HDL RATIO: 3.4 ratio (ref 0.0–5.0)
CHOLESTEROL TOTAL: 198 mg/dL (ref 100–199)
HDL: 59 mg/dL (ref >39)
LDL Calculated: 129 mg/dL — ABNORMAL HIGH (ref 0–99)
TRIGLYCERIDES: 52 mg/dL (ref 0–149)
VLDL Cholesterol Cal: 10 mg/dL (ref 5–40)

## 2018-05-12 LAB — BASIC METABOLIC PANEL
BUN/Creatinine Ratio: 16 (ref 10–24)
BUN: 19 mg/dL (ref 8–27)
CALCIUM: 10 mg/dL (ref 8.6–10.2)
CO2: 26 mmol/L (ref 20–29)
Chloride: 102 mmol/L (ref 96–106)
Creatinine, Ser: 1.17 mg/dL (ref 0.76–1.27)
GFR, EST AFRICAN AMERICAN: 72 mL/min/{1.73_m2} (ref >59)
GFR, EST NON AFRICAN AMERICAN: 62 mL/min/{1.73_m2} (ref >59)
Glucose: 94 mg/dL (ref 65–99)
POTASSIUM: 5 mmol/L (ref 3.5–5.2)
SODIUM: 143 mmol/L (ref 134–144)

## 2018-05-12 LAB — HEPATIC FUNCTION PANEL
ALBUMIN: 4.3 g/dL (ref 3.7–4.7)
ALK PHOS: 71 IU/L (ref 39–117)
ALT: 22 IU/L (ref 0–44)
AST: 22 IU/L (ref 0–40)
BILIRUBIN TOTAL: 0.3 mg/dL (ref 0.0–1.2)
Bilirubin, Direct: 0.09 mg/dL (ref 0.00–0.40)
TOTAL PROTEIN: 6.5 g/dL (ref 6.0–8.5)

## 2018-05-12 LAB — TSH: TSH: 1.12 u[IU]/mL (ref 0.450–4.500)

## 2018-05-15 ENCOUNTER — Encounter: Payer: Self-pay | Admitting: Family Medicine

## 2018-06-11 ENCOUNTER — Ambulatory Visit (INDEPENDENT_AMBULATORY_CARE_PROVIDER_SITE_OTHER): Payer: Medicare HMO | Admitting: Family Medicine

## 2018-06-11 ENCOUNTER — Encounter: Payer: Self-pay | Admitting: Family Medicine

## 2018-06-11 ENCOUNTER — Other Ambulatory Visit: Payer: Self-pay

## 2018-06-11 VITALS — BP 138/92 | Temp 97.9°F | Wt 192.6 lb

## 2018-06-11 DIAGNOSIS — E785 Hyperlipidemia, unspecified: Secondary | ICD-10-CM

## 2018-06-11 DIAGNOSIS — M722 Plantar fascial fibromatosis: Secondary | ICD-10-CM | POA: Diagnosis not present

## 2018-06-11 DIAGNOSIS — I10 Essential (primary) hypertension: Secondary | ICD-10-CM

## 2018-06-11 MED ORDER — AMLODIPINE BESYLATE 5 MG PO TABS: ORAL_TABLET | ORAL | 5 refills | Status: DC

## 2018-06-11 NOTE — Progress Notes (Signed)
   Subjective:    Patient ID: Anthony Glass, male    DOB: [birth year], 73 y.o.   MRN: [id number]  Foot Pain  This is a chronic problem. Episode onset: 7-8 months. The problem has been gradually worsening. Treatments tried: Aspirin. The treatment provided moderate relief.   Pt had a cold nonth and a half ago,  Had to miss some work for it   Blood pressure medicine and blood pressure levels reviewed today with patient. Compliant with blood pressure medicine. States does not miss a dose. No obvious side effects. Blood pressure generally good when checked elsewhere. Watching salt intake.  Patient continues to take lipid medication regularly. No obvious side effects from it. Generally does not miss a dose. Prior blood work results are reviewed with patient. Patient continues to work on fat intake in diet   Pt as plantar fasciitis. Pt states pain is worse in the morning and if he sits a while, pain does taper off as he walks. Pt has not seen a foot doctor before. Pt has Googled symptoms.   Review of Systems No headache, no major weight loss or weight gain, no chest pain no back pain abdominal pain no change in bowel habits complete ROS otherwise negative     Objective:   Physical Exam Alert and oriented, vitals reviewed and stable, NAD ENT-TM's and ext canals WNL bilat via otoscopic exam Soft palate, tonsils and post pharynx WNL via oropharyngeal exam Neck-symmetric, no masses; thyroid nonpalpable and nontender Pulmonary-no tachypnea or accessory muscle use; Clear without wheezes via auscultation Card--no abnrml murmurs, rhythm reg and rate WNL Carotid pulses symmetric, without bruits Right foot tender to palpation plantar fascia near the heel       Assessment & Plan:  Impression 1 hypertension good control blood pressure good on repeat discussed maintain same  2.  Hyperlipidemia prior blood work reviewed.  Medications maintain  3.  Plantar fasciitis discussed local measures  discussed stretching and anti-inflammatory meds  Follow-up in 6 months

## 2018-06-11 NOTE — Patient Instructions (Addendum)
Plantar Fasciitis      Plantar fasciitis is a painful foot condition that affects the heel. It occurs when the band of tissue that connects the toes to the heel bone (plantar fascia) becomes irritated. This can happen as the result of exercising too much or doing other repetitive activities (overuse injury).  The pain from plantar fasciitis can range from mild irritation to severe pain that makes it difficult to walk or move. The pain is usually worse in the morning after sleeping, or after sitting or lying down for a while. Pain may also be worse after long periods of walking or standing.  What are the causes?  This condition may be caused by:  Standing for long periods of time.  Wearing shoes that do not have good arch support.  Doing activities that put stress on joints (high-impact activities), including running, aerobics, and ballet.  Being overweight.  An abnormal way of walking (gait).  Tight muscles in the back of your lower leg (calf).  High arches in your feet.  Starting a new athletic activity.  What are the signs or symptoms?  The main symptom of this condition is heel pain. Pain may:  Be worse with first steps after a time of rest, especially in the morning after sleeping or after you have been sitting or lying down for a while.  Be worse after long periods of standing still.  Decrease after 30-45 minutes of activity, such as gentle walking.  How is this diagnosed?  This condition may be diagnosed based on your medical history and your symptoms. Your health care provider may ask questions about your activity level. Your health care provider will do a physical exam to check for:  A tender area on the bottom of your foot.  A high arch in your foot.  Pain when you move your foot.  Difficulty moving your foot.  You may have imaging tests to confirm the diagnosis, such as:  X-rays.  Ultrasound.  MRI.  How is this treated?  Treatment for plantar fasciitis depends on how severe your condition is.  Treatment may include:  Rest, ice, applying pressure (compression), and raising the affected foot (elevation). This may be called RICE therapy. Your health care provider may recommend RICE therapy along with over-the-counter pain medicines to manage your pain.  Exercises to stretch your calves and your plantar fascia.  A splint that holds your foot in a stretched, upward position while you sleep (night splint).  Physical therapy to relieve symptoms and prevent problems in the future.  Injections of steroid medicine (cortisone) to relieve pain and inflammation.  Stimulating your plantar fascia with electrical impulses (extracorporeal shock wave therapy). This is usually the last treatment option before surgery.  Surgery, if other treatments have not worked after 12 months.  Follow these instructions at home:      Managing pain, stiffness, and swelling  If directed, put ice on the painful area:  Put ice in a plastic bag, or use a frozen bottle of water.  Place a towel between your skin and the bag or bottle.  Roll the bottom of your foot over the bag or bottle.  Do this for 20 minutes, 2-3 times a day.  Wear athletic shoes that have air-sole or gel-sole cushions, or try wearing soft shoe inserts that are designed for plantar fasciitis.  Raise (elevate) your foot above the level of your heart while you are sitting or lying down.  Activity  Avoid activities that cause pain.   Ask your health care provider what activities are safe for you.  Do physical therapy exercises and stretches as told by your health care provider.  Try activities and forms of exercise that are easier on your joints (low-impact). Examples include swimming, water aerobics, and biking.  General instructions  Take over-the-counter and prescription medicines only as told by your health care provider.  Wear a night splint while sleeping, if told by your health care provider. Loosen the splint if your toes tingle, become numb, or turn cold and  blue.  Maintain a healthy weight, or work with your health care provider to lose weight as needed.  Keep all follow-up visits as told by your health care provider. This is important.  Contact a health care provider if you:  Have symptoms that do not go away after caring for yourself at home.  Have pain that gets worse.  Have pain that affects your ability to move or do your daily activities.  Summary  Plantar fasciitis is a painful foot condition that affects the heel. It occurs when the band of tissue that connects the toes to the heel bone (plantar fascia) becomes irritated.  The main symptom of this condition is heel pain that may be worse after exercising too much or standing still for a long time.  Treatment varies, but it usually starts with rest, ice, compression, and elevation (RICE therapy) and over-the-counter medicines to manage pain.  This information is not intended to replace advice given to you by your health care provider. Make sure you discuss any questions you have with your health care provider.  Document Released: 11/29/2000 Document Revised: 01/01/2017 Document Reviewed: 01/01/2017  Elsevier Interactive Patient Education © 2019 [employer]

## 2018-07-16 ENCOUNTER — Ambulatory Visit: Payer: Medicare HMO | Admitting: Family Medicine

## 2018-09-20 ENCOUNTER — Other Ambulatory Visit: Payer: Self-pay

## 2018-09-20 ENCOUNTER — Emergency Department (HOSPITAL_COMMUNITY)
Admission: EM | Admit: 2018-09-20 | Discharge: 2018-09-20 | Disposition: A | Discharge status: Home / Self Care | Payer: Medicare HMO | Attending: Emergency Medicine | Admitting: Emergency Medicine

## 2018-09-20 ENCOUNTER — Encounter (HOSPITAL_COMMUNITY): Payer: Self-pay | Admitting: Emergency Medicine

## 2018-09-20 DIAGNOSIS — S71111A Laceration without foreign body, right thigh, initial encounter: Secondary | ICD-10-CM | POA: Diagnosis present

## 2018-09-20 DIAGNOSIS — W260XXA Contact with knife, initial encounter: Secondary | ICD-10-CM | POA: Diagnosis not present

## 2018-09-20 DIAGNOSIS — Y92009 Unspecified place in unspecified non-institutional (private) residence as the place of occurrence of the external cause: Secondary | ICD-10-CM | POA: Insufficient documentation

## 2018-09-20 DIAGNOSIS — Z87891 Personal history of nicotine dependence: Secondary | ICD-10-CM | POA: Diagnosis not present

## 2018-09-20 DIAGNOSIS — Y9389 Activity, other specified: Secondary | ICD-10-CM | POA: Insufficient documentation

## 2018-09-20 DIAGNOSIS — Y998 Other external cause status: Secondary | ICD-10-CM | POA: Diagnosis not present

## 2018-09-20 DIAGNOSIS — S81811A Laceration without foreign body, right lower leg, initial encounter: Secondary | ICD-10-CM

## 2018-09-20 DIAGNOSIS — Z79899 Other long term (current) drug therapy: Secondary | ICD-10-CM | POA: Diagnosis not present

## 2018-09-20 MED ORDER — LIDOCAINE-EPINEPHRINE (PF) 2 %-1:200000 IJ SOLN
10.0000 mL | Freq: Once | INTRAMUSCULAR | Status: DC
  Filled 2018-09-20: qty 10

## 2018-09-20 NOTE — ED Triage Notes (Signed)
Patient with laceration to his R anterior thigh. Was cutting something with a box cutter and missed. Bleeding controlled.

## 2018-09-20 NOTE — ED Provider Notes (Signed)
Medical screening examination/treatment/procedure(s) were conducted as a shared visit with non-physician practitioner(s) and myself.  I personally evaluated the patient during the encounter.     Patient seen by me along with physician assistant.  Patient sustained a laceration to his right anterior thigh while he was cutting something with a box cutter and missed.  Bleeding controlled.  Repair as per physician assistant.  Approximately 2 cm laceration.  No obvious deep structure involvement.   Fredia Sorrow, MD 09/20/18 [id number]

## 2018-09-20 NOTE — ED Provider Notes (Signed)
[facility] EMERGENCY DEPARTMENT Provider Note   CSN: [id number] Arrival date & time: 09/20/18  1141    History   Chief Complaint Chief Complaint  Patient presents with  . Laceration    HPI Anthony Glass is a 73 y.o. male presenting for evaluation of r elg lac.  Pt states just PTA he was opening something with a box cutter when he missed and the box cutter cut his anterior R thigh. He reports mild pain around the lac, no pain elsewhere. No injury elsewhere. He denies numbness or tingling. He is not on blood thinners tdap is UTD (last given 2016). He has not taken anything for pain or cleaned the wound with anything.     HPI  Past Medical History:  Diagnosis Date  . BPH (benign prostatic hypertrophy)   . Bumps on skin    painful bump on left cheek  . Cancer ([place])    hx skin cancer  . Diastolic dysfunction 2/69/4854   Per echo. Ejection fraction 60-65%.  . History of skin cancer   . History of transfusion   . Hydronephrosis of left kidney 12/13/2011  . Hypothyroidism    "stopped taking thyroid med when I turned 65, [facility] out"  . Nephrolithiasis     Patient Active Problem List   Diagnosis Date Noted  . Essential hypertension 04/16/2018  . Hyperlipidemia 09/08/2015  . Renal calculus 02/18/2015  . Renal stones   . Blood loss anemia 12/13/2011  . Hydronephrosis of left kidney 12/13/2011  . Diastolic dysfunction 62/70/3500  . UTI (lower urinary tract infection) 12/12/2011  . Ureterolithiasis 12/10/2011  . Heme positive stool 12/10/2011  . Hyponatremia 12/10/2011    Past Surgical History:  Procedure Laterality Date  . CYSTOSCOPY PROSTATE W/ LASER    . CYSTOSCOPY W/ URETERAL STENT PLACEMENT  01/30/2012   Procedure: CYSTOSCOPY WITH RETROGRADE PYELOGRAM/URETERAL STENT PLACEMENT;  Surgeon: Marissa Nestle, MD;  Location: [facility];  Service: Urology;  Laterality: Left;  . CYSTOSCOPY W/ URETEROSCOPY W/ LITHOTRIPSY  12/12/2011   And insertion of  double-J stent on the left.  Marland Kitchen EXTRACORPOREAL SHOCK WAVE LITHOTRIPSY  01/03/2012   Procedure: EXTRACORPOREAL SHOCK WAVE LITHOTRIPSY (ESWL);  Surgeon: Marissa Nestle, MD;  Location: [facility];  Service: Urology;  Laterality: Left;  ESWL Left Renal Calculus  . NEPHROLITHOTOMY Left 02/18/2015   Procedure: LEFT PERCUTANEOUS NEPHROLITHOTOMY  ;  Surgeon: Franchot Gallo, MD;  Location: [facility];  Service: Urology;  Laterality: Left;  . STONE EXTRACTION WITH BASKET  12/12/2011   Procedure: STONE EXTRACTION WITH BASKET;  Surgeon: Marissa Nestle, MD;  Location: [facility];  Service: Urology;  Laterality: Left;  . STONE EXTRACTION WITH BASKET  01/30/2012   Procedure: STONE EXTRACTION WITH BASKET;  Surgeon: Marissa Nestle, MD;  Location: [facility];  Service: Urology;  Laterality: Left;  . TRANSTHORACIC ECHOCARDIOGRAM  12/11/2011   EF 60-65%. Right ventricle dilatation.  . TRANSURETHRAL RESECTION OF PROSTATE  2012  . URETHRAL STRICTURE DILATATION          Home Medications    Prior to Admission medications   Medication Sig Start Date End Date Taking? Authorizing Provider  amLODipine (NORVASC) 5 MG tablet One po Qhs 06/11/18   [name], MD  aspirin EC 325 MG tablet Take 325 mg by mouth daily as needed for mild pain. Reported on 06/08/2015    [provider]  MAGNESIUM PO Take 1 tablet by mouth every Monday, Wednesday, and Friday. He takes  two per day.    [provider]  Multiple Vitamin (MULTIVITAMIN WITH MINERALS) TABS tablet Take 1 tablet by mouth daily. Reported on 06/08/2015    [provider]  polyvinyl alcohol (LIQUIFILM TEARS) 1.4 % ophthalmic solution Place 1 drop into both eyes 2 (two) times daily as needed for dry eyes. Reported on 06/08/2015    [provider]    Family History Family History  Problem Relation Age of Onset  . Diabetes Other     Social History Social History   Tobacco Use  . Smoking status: Former Smoker    Packs/day: 0.00     Years: 20.00    Pack years: 0.00    Types: Cigarettes    Quit date: 02/09/1982    Years since quitting: 36.6  . Smokeless tobacco: Former Systems developer    Quit date: 12/27/1981  Substance Use Topics  . Alcohol use: Yes    Comment: 1-2 drinks daily   . Drug use: No     Allergies   Patient has no known allergies.   Review of Systems Review of Systems  Skin: Positive for wound.  Hematological: Does not bruise/bleed easily.     Physical Exam Updated Vital Signs BP (!) 140/110   Pulse 78   Temp 98.2 F (36.8 C) (Oral)   Resp 16   SpO2 96%   Physical Exam Vitals signs and nursing note reviewed.  Constitutional:      General: He is not in acute distress.    Appearance: He is well-developed.  HENT:     Head: Normocephalic and atraumatic.  Neck:     Musculoskeletal: Normal range of motion.  Pulmonary:     Effort: Pulmonary effort is normal.  Abdominal:     General: There is no distension.  Musculoskeletal: Normal range of motion.     Comments: Full active ROM of the knee. Good lower extremity strength. Pt able to hold leg in extention without difficulty or pain. No numbness. Pedal pulses intact.   Skin:    General: Skin is warm.     Capillary Refill: Capillary refill takes less than 2 seconds.     Findings: No rash.     Comments: 2 cm lac of the R anterior thigh, approx 3 mm deep. No active bleeding.  Neurological:     Mental Status: He is alert and oriented to person, place, and time.      ED Treatments / Results  Labs (all labs ordered are listed, but only abnormal results are displayed) Labs Reviewed - No data to display  EKG None  Radiology No results found.  Procedures .Marland KitchenLaceration Repair  Date/Time: 09/20/2018 12:34 PM Performed by: Franchot Heidelberg, PA-C Authorized by: Franchot Heidelberg, PA-C   Consent:    Consent obtained:  Verbal   Consent given by:  Patient   Risks discussed:  Infection, need for additional repair, nerve damage, pain, poor  cosmetic result, poor wound healing, tendon damage and vascular damage Anesthesia (see MAR for exact dosages):    Anesthesia method:  Local infiltration   Local anesthetic:  Lidocaine 2% WITH epi Laceration details:    Location:  Leg   Leg location:  R upper leg   Length (cm):  2   Depth (mm):  3 Repair type:    Repair type:  Simple Pre-procedure details:    Preparation:  Patient was prepped and draped in usual sterile fashion Exploration:    Wound exploration: wound explored through full range of motion and entire  depth of wound probed and visualized     Wound extent: no fascia violation noted, no foreign bodies/material noted, no muscle damage noted, no nerve damage noted, no tendon damage noted and no vascular damage noted     Contaminated: no   Treatment:    Area cleansed with:  Saline   Amount of cleaning:  Standard   Irrigation solution:  Sterile water   Irrigation method:  Pressure wash Skin repair:    Repair method:  Sutures   Suture size:  4-0   Suture material:  Prolene   Suture technique:  Horizontal mattress   Number of sutures:  3 Approximation:    Approximation:  Close Post-procedure details:    Dressing:  Sterile dressing   Patient tolerance of procedure:  Tolerated well, no immediate complications   (including critical care time)  Medications Ordered in ED Medications  lidocaine-EPINEPHrine (XYLOCAINE W/EPI) 2 %-1:200000 (PF) injection 10 mL (has no administration in time range)     Initial Impression / Assessment and Plan / ED Course  I have reviewed the triage vital signs and the nursing notes.  Pertinent labs & imaging results that were available during my care of the patient were reviewed by me and considered in my medical decision making (see chart for details).        Patient presenting for evaluation of anterior thigh laceration.  Physical exam reassuring, he is neurovascularly intact.  No obvious strength deficits or numbness.  Patient is  ambulatory.  Tetanus is up-to-date.  Laceration repaired as described above.  Discussed aftercare instructions.  Case discussed with attending, Dr. Rogene Houston evaluated the patient.  At this time, patient appears safe for discharge.  Return precautions given.  Patient states he understands and agrees to plan.   Final Clinical Impressions(s) / ED Diagnoses   Final diagnoses:  Laceration of right lower extremity, initial encounter    ED Discharge Orders    None       Franchot Heidelberg, PA-C 09/20/18 1310    Fredia Sorrow, MD 09/28/18 1622

## 2018-09-20 NOTE — Discharge Instructions (Signed)
1. Medications: Tylenol or ibuprofen for pain, usual home medications 2. Treatment: ice for swelling, keep wound clean with warm soap and water and keep bandage dry 3. Follow Up: Please return in 7-10 days to have your stitches removed or sooner if you have concerns. Return to the emergency department for increased redness, drainage of pus from the wound   WOUND CARE  Remove bandage and wash wound gently with mild soap and warm water. Reapply a new bandage after cleaning wound, if directed.   Continue daily cleansing with soap and water until stitches are removed.  Do not apply any ointments or creams to the wound while stitches are in place, as this may cause delayed healing. Return if you experience any of the following signs of infection: Swelling, redness, pus drainage, streaking, fever >101.0 F  Return if you experience excessive bleeding that does not stop after 15-20 minutes of constant, firm pressure.

## 2018-10-07 ENCOUNTER — Other Ambulatory Visit: Payer: Self-pay

## 2018-10-07 ENCOUNTER — Ambulatory Visit (INDEPENDENT_AMBULATORY_CARE_PROVIDER_SITE_OTHER): Payer: Medicare HMO | Admitting: Family Medicine

## 2018-10-07 ENCOUNTER — Encounter: Payer: Self-pay | Admitting: Family Medicine

## 2018-10-07 DIAGNOSIS — I10 Essential (primary) hypertension: Secondary | ICD-10-CM

## 2018-10-07 MED ORDER — AMLODIPINE BESYLATE 10 MG PO TABS: ORAL_TABLET | ORAL | 5 refills | Status: DC

## 2018-10-07 NOTE — Addendum Note (Signed)
Addended by: Carmelina Noun on: 10/07/2018 01:49 PM   Modules accepted: Orders

## 2018-10-07 NOTE — Progress Notes (Signed)
   Subjective:  Audio only  Patient ID: Anthony Glass, male    DOB: [birth year], 73 y.o.   MRN: [id number]  Hypertension This is a chronic problem. Compliance problems: walks for exercise, tries to eat healthy, takes medication.   pt states he went to ED on 09/20/18 for laceration and his bp was high at ED. Pt does not have a bp monitor to check at home.   Virtual Visit via Telephone Note  I connected with Courtney Heys on 10/07/18 at  1:10 PM EDT by telephone and verified that I am speaking with the correct person using two identifiers.  Location: Patient: home Provider: office   I discussed the limitations, risks, security and privacy concerns of performing an evaluation and management service by telephone and the availability of in person appointments. I also discussed with the patient that there may be a patient responsible charge related to this service. The patient expressed understanding and agreed to proceed.   History of Present Illness:    Observations/Objective:   Assessment and Plan:   Follow Up Instructions:    I discussed the assessment and treatment plan with the patient. The patient was provided an opportunity to ask questions and all were answered. The patient agreed with the plan and demonstrated an understanding of the instructions.   The patient was advised to call back or seek an in-person evaluation if the symptoms worsen or if the condition fails to improve as anticipated.  I provided 38minutes of non-face-to-face time during this encounter.  Last visit here in the spring patient's blood pressure was elevated.  Went to emergency room.  Systolic blood pressure substantially elevated.  Diastolic also.  Patient has been on this dose for a while.  Compliant with dosage.  Diet and exercise also compliant     Review of Systems No headache, no major weight loss or weight gain, no chest pain no back pain abdominal pain no change in bowel habits complete ROS  otherwise negative     Objective:   Physical Exam  Virtual      Assessment & Plan:  Impression essential hypertension.  Control unsatisfactory discussed increase Norvasc to 10 mg daily.  Diet exercise discussed.  Follow-up in 6 months for wellness plus chronic

## 2018-12-11 ENCOUNTER — Telehealth: Payer: Self-pay | Admitting: Family Medicine

## 2018-12-11 DIAGNOSIS — R233 Spontaneous ecchymoses: Secondary | ICD-10-CM

## 2018-12-11 NOTE — Telephone Encounter (Signed)
Please advise. Thank you

## 2018-12-11 NOTE — Telephone Encounter (Signed)
Patient needs a new dermatology referral to see Dr. [place] Crane again because he has some spots he wants him to look at.  It's been a few years and they said they need a new one.

## 2018-12-11 NOTE — Telephone Encounter (Signed)
Let's do 

## 2018-12-11 NOTE — Telephone Encounter (Signed)
Referral ordered in EPIC. 

## 2019-01-13 DIAGNOSIS — L821 Other seborrheic keratosis: Secondary | ICD-10-CM | POA: Diagnosis not present

## 2019-01-13 DIAGNOSIS — X32XXXD Exposure to sunlight, subsequent encounter: Secondary | ICD-10-CM | POA: Diagnosis not present

## 2019-01-13 DIAGNOSIS — L570 Actinic keratosis: Secondary | ICD-10-CM | POA: Diagnosis not present

## 2019-02-21 ENCOUNTER — Telehealth: Payer: Self-pay | Admitting: Family Medicine

## 2019-02-21 DIAGNOSIS — Z1329 Encounter for screening for other suspected endocrine disorder: Secondary | ICD-10-CM

## 2019-02-21 DIAGNOSIS — Z125 Encounter for screening for malignant neoplasm of prostate: Secondary | ICD-10-CM

## 2019-02-21 DIAGNOSIS — E785 Hyperlipidemia, unspecified: Secondary | ICD-10-CM

## 2019-02-21 DIAGNOSIS — Z79899 Other long term (current) drug therapy: Secondary | ICD-10-CM

## 2019-02-21 DIAGNOSIS — I10 Essential (primary) hypertension: Secondary | ICD-10-CM

## 2019-02-21 NOTE — Telephone Encounter (Signed)
Rep same 

## 2019-02-21 NOTE — Telephone Encounter (Signed)
Last labs 04/2018- Lipid, TSH, CBC, Liver, Met 7 PSA

## 2019-02-21 NOTE — Telephone Encounter (Signed)
Lab orders placed. Left message to return call  

## 2019-02-21 NOTE — Telephone Encounter (Signed)
Pt has cpe scheduled 1/6. Pt would like to get lab work done before appt.

## 2019-02-24 NOTE — Telephone Encounter (Signed)
Pt called back. Informed pt lab work has been placed.

## 2019-03-26 ENCOUNTER — Encounter: Payer: Medicare HMO | Admitting: Family Medicine

## 2019-04-01 DIAGNOSIS — Z125 Encounter for screening for malignant neoplasm of prostate: Secondary | ICD-10-CM | POA: Diagnosis not present

## 2019-04-01 DIAGNOSIS — Z1329 Encounter for screening for other suspected endocrine disorder: Secondary | ICD-10-CM | POA: Diagnosis not present

## 2019-04-01 DIAGNOSIS — Z79899 Other long term (current) drug therapy: Secondary | ICD-10-CM | POA: Diagnosis not present

## 2019-04-01 DIAGNOSIS — E785 Hyperlipidemia, unspecified: Secondary | ICD-10-CM | POA: Diagnosis not present

## 2019-04-01 DIAGNOSIS — I10 Essential (primary) hypertension: Secondary | ICD-10-CM | POA: Diagnosis not present

## 2019-04-02 LAB — HEPATIC FUNCTION PANEL
ALT: 21 IU/L (ref 0–44)
AST: 25 IU/L (ref 0–40)
Albumin: 4.3 g/dL (ref 3.7–4.7)
Alkaline Phosphatase: 79 IU/L (ref 39–117)
Bilirubin Total: 0.4 mg/dL (ref 0.0–1.2)
Bilirubin, Direct: 0.09 mg/dL (ref 0.00–0.40)
Total Protein: 6.5 g/dL (ref 6.0–8.5)

## 2019-04-02 LAB — CBC WITH DIFFERENTIAL/PLATELET
Basophils Absolute: 0 10*3/uL (ref 0.0–0.2)
Basos: 1 %
EOS (ABSOLUTE): 0.1 10*3/uL (ref 0.0–0.4)
Eos: 1 %
Hematocrit: 43.7 % (ref 37.5–51.0)
Hemoglobin: 15.1 g/dL (ref 13.0–17.7)
Immature Grans (Abs): 0 10*3/uL (ref 0.0–0.1)
Immature Granulocytes: 0 %
Lymphocytes Absolute: 1 10*3/uL (ref 0.7–3.1)
Lymphs: 18 %
MCH: 32 pg (ref 26.6–33.0)
MCHC: 34.6 g/dL (ref 31.5–35.7)
MCV: 93 fL (ref 79–97)
Monocytes Absolute: 0.6 10*3/uL (ref 0.1–0.9)
Monocytes: 12 %
Neutrophils Absolute: 3.8 10*3/uL (ref 1.4–7.0)
Neutrophils: 68 %
Platelets: 276 10*3/uL (ref 150–450)
RBC: 4.72 x10E6/uL (ref 4.14–5.80)
RDW: 12.7 % (ref 11.6–15.4)
WBC: 5.5 10*3/uL (ref 3.4–10.8)

## 2019-04-02 LAB — BASIC METABOLIC PANEL
BUN/Creatinine Ratio: 20 (ref 10–24)
BUN: 25 mg/dL (ref 8–27)
CO2: 28 mmol/L (ref 20–29)
Calcium: 10 mg/dL (ref 8.6–10.2)
Chloride: 104 mmol/L (ref 96–106)
Creatinine, Ser: 1.25 mg/dL (ref 0.76–1.27)
GFR calc Af Amer: 66 mL/min/{1.73_m2} (ref >59)
GFR calc non Af Amer: 57 mL/min/{1.73_m2} — ABNORMAL LOW (ref >59)
Glucose: 95 mg/dL (ref 65–99)
Potassium: 4.7 mmol/L (ref 3.5–5.2)
Sodium: 143 mmol/L (ref 134–144)

## 2019-04-02 LAB — LIPID PANEL
Chol/HDL Ratio: 3.1 ratio (ref 0.0–5.0)
Cholesterol, Total: 208 mg/dL — ABNORMAL HIGH (ref 100–199)
HDL: 67 mg/dL (ref >39)
LDL Chol Calc (NIH): 131 mg/dL — ABNORMAL HIGH (ref 0–99)
Triglycerides: 58 mg/dL (ref 0–149)
VLDL Cholesterol Cal: 10 mg/dL (ref 5–40)

## 2019-04-02 LAB — TSH: TSH: 0.998 u[IU]/mL (ref 0.450–4.500)

## 2019-04-02 LAB — PSA: Prostate Specific Ag, Serum: 0.8 ng/mL (ref 0.0–4.0)

## 2019-04-07 ENCOUNTER — Other Ambulatory Visit: Payer: Self-pay | Admitting: Family Medicine

## 2019-04-16 ENCOUNTER — Other Ambulatory Visit: Payer: Self-pay

## 2019-04-16 ENCOUNTER — Ambulatory Visit (INDEPENDENT_AMBULATORY_CARE_PROVIDER_SITE_OTHER): Payer: Medicare HMO | Admitting: Family Medicine

## 2019-04-16 ENCOUNTER — Encounter: Payer: Self-pay | Admitting: Family Medicine

## 2019-04-16 VITALS — BP 118/84 | Temp 98.3°F | Ht 73.0 in | Wt 183.0 lb

## 2019-04-16 DIAGNOSIS — Z23 Encounter for immunization: Secondary | ICD-10-CM

## 2019-04-16 DIAGNOSIS — Z0000 Encounter for general adult medical examination without abnormal findings: Secondary | ICD-10-CM | POA: Diagnosis not present

## 2019-04-16 DIAGNOSIS — I10 Essential (primary) hypertension: Secondary | ICD-10-CM

## 2019-04-16 MED ORDER — ZOSTER VAC RECOMB ADJUVANTED 50 MCG/0.5ML IM SUSR: 0.5000 mL | Freq: Once | INTRAMUSCULAR | 1 refills | Status: AC

## 2019-04-16 MED ORDER — AMLODIPINE BESYLATE 10 MG PO TABS: ORAL_TABLET | ORAL | 1 refills | Status: DC

## 2019-04-16 NOTE — Progress Notes (Signed)
Subjective:    Patient ID: Anthony Glass, male    DOB: 1945/10/21, 74 y.o.   MRN: [id number]  HPI AWV- Annual Wellness Visit  The patient was seen for their annual wellness visit. The patient's past medical history, surgical history, and family history were reviewed. Pertinent vaccines were reviewed ( tetanus, pneumonia, shingles, flu) The patient's medication list was reviewed and updated.  The height and weight were entered.  BMI recorded in electronic record elsewhere  Cognitive screening was completed. Outcome of Mini - Cog: pass   Falls /depression screening electronically recorded within record elsewhere  Current tobacco usage: none (All patients who use tobacco were given written and verbal information on quitting)  Recent listing of emergency department/hospitalizations over the past year were reviewed.  current specialist the patient sees on a regular basis: none   Medicare annual wellness visit patient questionnaire was reviewed.  A written screening schedule for the patient for the next 5-10 years was given. Appropriate discussion of followup regarding next visit was discussed.  Would like a flu vaccine today.   Results for orders placed or performed in visit on 02/21/19  Lipid Profile  Result Value Ref Range   Cholesterol, Total 208 (H) 100 - 199 mg/dL   Triglycerides 58 0 - 149 mg/dL   HDL 67 >39 mg/dL   VLDL Cholesterol Cal 10 5 - 40 mg/dL   LDL Chol Calc (NIH) 131 (H) 0 - 99 mg/dL   Chol/HDL Ratio 3.1 0.0 - 5.0 ratio  TSH  Result Value Ref Range   TSH 0.998 0.450 - 4.500 uIU/mL  CBC with Differential  Result Value Ref Range   WBC 5.5 3.4 - 10.8 x10E3/uL   RBC 4.72 4.14 - 5.80 x10E6/uL   Hemoglobin 15.1 13.0 - 17.7 g/dL   Hematocrit 43.7 37.5 - 51.0 %   MCV 93 79 - 97 fL   MCH 32.0 26.6 - 33.0 pg   MCHC 34.6 31.5 - 35.7 g/dL   RDW 12.7 11.6 - 15.4 %   Platelets 276 150 - 450 x10E3/uL   Neutrophils 68 Not Estab. %   Lymphs 18 Not Estab. %   Monocytes 12 Not Estab. %   Eos 1 Not Estab. %   Basos 1 Not Estab. %   Neutrophils Absolute 3.8 1.4 - 7.0 x10E3/uL   Lymphocytes Absolute 1.0 0.7 - 3.1 x10E3/uL   Monocytes Absolute 0.6 0.1 - 0.9 x10E3/uL   EOS (ABSOLUTE) 0.1 0.0 - 0.4 x10E3/uL   Basophils Absolute 0.0 0.0 - 0.2 x10E3/uL   Immature Granulocytes 0 Not Estab. %   Immature Grans (Abs) 0.0 0.0 - 0.1 x10E3/uL  Hepatic function panel  Result Value Ref Range   Total Protein 6.5 6.0 - 8.5 g/dL   Albumin 4.3 3.7 - 4.7 g/dL   Bilirubin Total 0.4 0.0 - 1.2 mg/dL   Bilirubin, Direct 0.09 0.00 - 0.40 mg/dL   Alkaline Phosphatase 79 39 - 117 IU/L   AST 25 0 - 40 IU/L   ALT 21 0 - 44 IU/L  Basic Metabolic Panel (BMET)  Result Value Ref Range   Glucose 95 65 - 99 mg/dL   BUN 25 8 - 27 mg/dL   Creatinine, Ser 1.25 0.76 - 1.27 mg/dL   GFR calc non Af Amer 57 (L) >59 mL/min/1.73   GFR calc Af Amer 66 >59 mL/min/1.73   BUN/Creatinine Ratio 20 10 - 24   Sodium 143 134 - 144 mmol/L   Potassium 4.7 3.5 - 5.2 mmol/L  Chloride 104 96 - 106 mmol/L   CO2 28 20 - 29 mmol/L   Calcium 10.0 8.6 - 10.2 mg/dL  PSA  Result Value Ref Range   Prostate Specific Ag, Serum 0.8 0.0 - 4.0 ng/mL   Blood pressure medicine and blood pressure levels reviewed today with patient. Compliant with blood pressure medicine. States does not miss a dose. No obvious side effects. Blood pressure generally good when checked elsewhere. Watching salt intake.    Review of Systems  Constitutional: Negative for activity change, appetite change and fever.  HENT: Negative for congestion and rhinorrhea.   Eyes: Negative for discharge.  Respiratory: Negative for cough and wheezing.   Cardiovascular: Negative for chest pain.  Gastrointestinal: Negative for abdominal pain, blood in stool and vomiting.  Genitourinary: Negative for difficulty urinating and frequency.  Musculoskeletal: Negative for neck pain.  Skin: Negative for rash.  Allergic/Immunologic: Negative  for environmental allergies and food allergies.  Neurological: Negative for weakness and headaches.  Psychiatric/Behavioral: Negative for agitation.  All other systems reviewed and are negative.      Objective:   Physical Exam Vitals reviewed.  Constitutional:      Appearance: He is well-developed.  HENT:     Head: Normocephalic and atraumatic.     Right Ear: External ear normal.     Left Ear: External ear normal.     Nose: Nose normal.  Eyes:     Pupils: Pupils are equal, round, and reactive to light.  Neck:     Thyroid: No thyromegaly.  Cardiovascular:     Rate and Rhythm: Normal rate and regular rhythm.     Heart sounds: Normal heart sounds. No murmur.  Pulmonary:     Effort: Pulmonary effort is normal. No respiratory distress.     Breath sounds: Normal breath sounds. No wheezing.  Abdominal:     General: Bowel sounds are normal. There is no distension.     Palpations: Abdomen is soft. There is no mass.     Tenderness: There is no abdominal tenderness.  Genitourinary:    Penis: Normal.   Musculoskeletal:        General: Normal range of motion.     Cervical back: Normal range of motion and neck supple.  Lymphadenopathy:     Cervical: No cervical adenopathy.  Skin:    General: Skin is warm and dry.     Findings: No erythema.  Neurological:     Mental Status: He is alert.     Motor: No abnormal muscle tone.  Psychiatric:        Behavior: Behavior normal.        Judgment: Judgment normal.   Prostate exam within normal limits        Assessment & Plan:  Impression 1 wellness exam.  Diet discussed.  Exercise discussed.  Up-to-date on colonoscopy.  Vaccines discussed.  Flu shot today.  2.  Hypertension.  Control discussed.  Blood pressure stable on meds.  Compliant with medications.  3.  Blood work.  Mild hyperlipidemia.  However counteracted by [place] HDL.  Diet discussed  Follow-up in 6 months

## 2019-04-24 ENCOUNTER — Encounter: Payer: Self-pay | Admitting: Family Medicine

## 2020-01-09 ENCOUNTER — Other Ambulatory Visit: Payer: Self-pay | Admitting: *Deleted

## 2020-01-09 NOTE — Telephone Encounter (Signed)
Refill request for norvasc from [facility]. Please contact pt to schedule appointment with Dr. Lovena Le and send back for refill.

## 2020-01-12 ENCOUNTER — Other Ambulatory Visit: Payer: Self-pay | Admitting: *Deleted

## 2020-01-12 MED ORDER — AMLODIPINE BESYLATE 10 MG PO TABS: ORAL_TABLET | ORAL | 0 refills | Status: DC

## 2020-01-13 ENCOUNTER — Other Ambulatory Visit: Payer: Self-pay

## 2020-01-13 ENCOUNTER — Encounter: Payer: Self-pay | Admitting: Family Medicine

## 2020-01-13 ENCOUNTER — Ambulatory Visit (INDEPENDENT_AMBULATORY_CARE_PROVIDER_SITE_OTHER): Payer: Medicare HMO | Admitting: Family Medicine

## 2020-01-13 VITALS — BP 118/80 | HR 95 | Temp 97.5°F | Ht 73.0 in | Wt 186.0 lb

## 2020-01-13 DIAGNOSIS — R195 Other fecal abnormalities: Secondary | ICD-10-CM

## 2020-01-13 DIAGNOSIS — I10 Essential (primary) hypertension: Secondary | ICD-10-CM | POA: Diagnosis not present

## 2020-01-13 DIAGNOSIS — E785 Hyperlipidemia, unspecified: Secondary | ICD-10-CM

## 2020-01-13 NOTE — Progress Notes (Signed)
Patient ID: Anthony Glass, male    DOB: 1946-03-19, 74 y.o.   MRN: [id number]   Chief Complaint  Patient presents with  . Hypertension   Subjective:    HPI  htn check up. Has been out of bp med for 2 days. Not having chest pain, headache, dizzines, sob, or leg swelling.  Pt noticing a "parasite" in his stool  "[name]," thinking might have a parasite or small worms in his stool.  Pt brought 2 pics on flash drive. Not moving looked dead.   Pt saw it 1 month ago. Saw it once in his stool.   Not having any abdominal pain. Nv/d.  Not had any out of country travel. Not drinking from any fresh water sources.  HLD- not on meds.  Last check has slight elevated LDL, but has good HDL.  Medical History Anthony Glass has a past medical history of BPH (benign prostatic hypertrophy), Bumps on skin, Cancer (Anthony Glass), Diastolic dysfunction (12/04/6058), History of skin cancer, History of transfusion, Hydronephrosis of left kidney (12/13/2011), Hypothyroidism, and Nephrolithiasis.   Outpatient Encounter Medications as of 01/13/2020  Medication Sig  . aspirin EC 325 MG tablet Take 325 mg by mouth daily as needed for mild pain. Reported on 06/08/2015  . MAGNESIUM PO Take 1 tablet by mouth every Monday, Wednesday, and Friday. He takes two per day.  . Multiple Vitamin (MULTIVITAMIN WITH MINERALS) TABS tablet Take 1 tablet by mouth daily. Reported on 06/08/2015  . polyvinyl alcohol (LIQUIFILM TEARS) 1.4 % ophthalmic solution Place 1 drop into both eyes 2 (two) times daily as needed for dry eyes. Reported on 06/08/2015  . [DISCONTINUED] amLODipine (NORVASC) 10 MG tablet TAKE 1 TABLET BY MOUTH EVERY DAY AT BEDTIME (CANCEL  5  MG)   No facility-administered encounter medications on file as of 01/13/2020.     Review of Systems  Constitutional: Negative for chills and fever.  HENT: Negative for congestion, rhinorrhea and sore throat.   Respiratory: Negative for cough, shortness of breath and  wheezing.   Cardiovascular: Negative for chest pain and leg swelling.  Gastrointestinal: Negative for abdominal pain, diarrhea, nausea and vomiting.       +concern about seeing a parasite in stool 1 mo ago.  Genitourinary: Negative for dysuria and frequency.  Skin: Negative for rash.  Neurological: Negative for dizziness, weakness and headaches.     Vitals BP 118/80   Pulse 95   Temp (!) 97.5 F (36.4 C)   Ht '6\' 1"'  (1.854 m)   Wt 186 lb (84.4 kg)   SpO2 97%   BMI 24.54 kg/m   Objective:   Physical Exam Vitals and nursing note reviewed.  Constitutional:      General: He is not in acute distress.    Appearance: Normal appearance. He is not ill-appearing.  HENT:     Head: Normocephalic.     Nose: Nose normal. No congestion.     Mouth/Throat:     Mouth: Mucous membranes are moist.     Pharynx: No oropharyngeal exudate.  Eyes:     Extraocular Movements: Extraocular movements intact.     Conjunctiva/sclera: Conjunctivae normal.     Pupils: Pupils are equal, round, and reactive to light.  Cardiovascular:     Rate and Rhythm: Normal rate and regular rhythm.     Pulses: Normal pulses.     Heart sounds: Normal heart sounds. No murmur heard.   Pulmonary:     Effort: Pulmonary effort is normal.  Breath sounds: Normal breath sounds. No wheezing, rhonchi or rales.  Musculoskeletal:        General: Normal range of motion.     Right lower leg: No edema.     Left lower leg: No edema.  Skin:    General: Skin is warm and dry.     Findings: No rash.  Neurological:     General: No focal deficit present.     Mental Status: He is alert and oriented to person, place, and time.     Cranial Nerves: No cranial nerve deficit.  Psychiatric:        Mood and Affect: Mood normal.        Behavior: Behavior normal.        Thought Content: Thought content normal.        Judgment: Judgment normal.      Assessment and Plan   1. Essential hypertension - CBC - CMP14+EGFR - Lipid  panel  2. Abnormal findings in stool  3. Hyperlipidemia, unspecified hyperlipidemia type   Gave sterile cup for pt to try to get a sample of the "worm" and bring back to office.   htn- stable. Cont amlodipine. hld- stable.  Cont to watch diet. Not on meds.  F/u [address] prn. Pt in agreement.

## 2020-01-14 LAB — LIPID PANEL
Chol/HDL Ratio: 3.6 ratio (ref 0.0–5.0)
Cholesterol, Total: 198 mg/dL (ref 100–199)
HDL: 55 mg/dL (ref >39)
LDL Chol Calc (NIH): 129 mg/dL — ABNORMAL HIGH (ref 0–99)
Triglycerides: 75 mg/dL (ref 0–149)
VLDL Cholesterol Cal: 14 mg/dL (ref 5–40)

## 2020-01-14 LAB — CMP14+EGFR
ALT: 18 IU/L (ref 0–44)
AST: 24 IU/L (ref 0–40)
Albumin/Globulin Ratio: 2 (ref 1.2–2.2)
Albumin: 4.3 g/dL (ref 3.7–4.7)
Alkaline Phosphatase: 76 IU/L (ref 44–121)
BUN/Creatinine Ratio: 23 (ref 10–24)
BUN: 20 mg/dL (ref 8–27)
Bilirubin Total: 0.3 mg/dL (ref 0.0–1.2)
CO2: 24 mmol/L (ref 20–29)
Calcium: 9.8 mg/dL (ref 8.6–10.2)
Chloride: 105 mmol/L (ref 96–106)
Creatinine, Ser: 0.88 mg/dL (ref 0.76–1.27)
GFR calc Af Amer: 98 mL/min/{1.73_m2} (ref >59)
GFR calc non Af Amer: 85 mL/min/{1.73_m2} (ref >59)
Globulin, Total: 2.1 g/dL (ref 1.5–4.5)
Glucose: 93 mg/dL (ref 65–99)
Potassium: 4.9 mmol/L (ref 3.5–5.2)
Sodium: 139 mmol/L (ref 134–144)
Total Protein: 6.4 g/dL (ref 6.0–8.5)

## 2020-01-14 LAB — CBC
Hematocrit: 41.4 % (ref 37.5–51.0)
Hemoglobin: 13.8 g/dL (ref 13.0–17.7)
MCH: 30.9 pg (ref 26.6–33.0)
MCHC: 33.3 g/dL (ref 31.5–35.7)
MCV: 93 fL (ref 79–97)
Platelets: 265 10*3/uL (ref 150–450)
RBC: 4.47 x10E6/uL (ref 4.14–5.80)
RDW: 12.9 % (ref 11.6–15.4)
WBC: 4.7 10*3/uL (ref 3.4–10.8)

## 2020-01-16 ENCOUNTER — Other Ambulatory Visit: Payer: Self-pay | Admitting: *Deleted

## 2020-01-16 MED ORDER — AMLODIPINE BESYLATE 10 MG PO TABS
ORAL_TABLET | ORAL | 0 refills | Status: DC
Start: 2020-01-16 — End: 2020-05-12

## 2020-01-22 ENCOUNTER — Telehealth: Payer: Self-pay | Admitting: *Deleted

## 2020-01-22 ENCOUNTER — Other Ambulatory Visit: Payer: Self-pay | Admitting: *Deleted

## 2020-01-22 DIAGNOSIS — R195 Other fecal abnormalities: Secondary | ICD-10-CM

## 2020-01-22 DIAGNOSIS — I10 Essential (primary) hypertension: Secondary | ICD-10-CM

## 2020-01-22 NOTE — Telephone Encounter (Signed)
Lm for pt to let him know Dr. Lovena Le was not able to see anything in the stool sample that was dropped off yesterday. She would like him to come by the office to pick up OV&P and stool culture supplies that he can complete and take to the lab within 2 hours of collecting. Supplies left at nurses station.

## 2020-01-27 NOTE — Telephone Encounter (Signed)
Left another message for pt to return call

## 2020-02-03 NOTE — Telephone Encounter (Signed)
Left another message to return call. Please mail pt letter to contact our office.

## 2020-02-05 ENCOUNTER — Telehealth: Payer: Self-pay | Admitting: *Deleted

## 2020-02-05 NOTE — Telephone Encounter (Signed)
Copied and pasted from previous message on 11/4 Lm for pt to let him know Dr. Lovena Le was not able to see anything in the stool sample that was dropped off yesterday. She would like him to come by the office to pick up OV&P and stool culture supplies that he can complete and take to the lab within 2 hours of collecting. Supplies left at nurses station.    Pt called back and I discussed with pt and left stool containers at the front window for pt to pick up. Pt states he will pick up next week.

## 2020-03-08 DIAGNOSIS — R195 Other fecal abnormalities: Secondary | ICD-10-CM | POA: Diagnosis not present

## 2020-03-13 LAB — STOOL CULTURE: E coli, Shiga toxin Assay: NEGATIVE

## 2020-03-17 LAB — OVA AND PARASITE EXAMINATION

## 2020-05-10 ENCOUNTER — Other Ambulatory Visit: Payer: Self-pay | Admitting: Family Medicine

## 2020-05-19 ENCOUNTER — Ambulatory Visit (INDEPENDENT_AMBULATORY_CARE_PROVIDER_SITE_OTHER): Payer: Medicare HMO | Admitting: Family Medicine

## 2020-05-19 ENCOUNTER — Other Ambulatory Visit: Payer: Self-pay | Admitting: Family Medicine

## 2020-05-19 ENCOUNTER — Ambulatory Visit (HOSPITAL_COMMUNITY)
Admission: RE | Admit: 2020-05-19 | Discharge: 2020-05-19 | Disposition: A | Discharge status: Home / Self Care | Payer: Medicare HMO | Source: Ambulatory Visit | Attending: Family Medicine | Admitting: Family Medicine

## 2020-05-19 ENCOUNTER — Other Ambulatory Visit: Payer: Self-pay

## 2020-05-19 ENCOUNTER — Encounter: Payer: Self-pay | Admitting: Family Medicine

## 2020-05-19 VITALS — BP 129/85 | HR 66 | Temp 97.2°F | Wt 194.2 lb

## 2020-05-19 DIAGNOSIS — M25551 Pain in right hip: Secondary | ICD-10-CM

## 2020-05-19 DIAGNOSIS — M47816 Spondylosis without myelopathy or radiculopathy, lumbar region: Secondary | ICD-10-CM | POA: Diagnosis not present

## 2020-05-19 DIAGNOSIS — M25561 Pain in right knee: Secondary | ICD-10-CM | POA: Diagnosis not present

## 2020-05-19 MED ORDER — MELOXICAM 7.5 MG PO TABS: 7.5000 mg | ORAL_TABLET | Freq: Every day | ORAL | 1 refills | Status: DC

## 2020-05-19 NOTE — Progress Notes (Signed)
Patient ID: Anthony Glass, male    DOB: Jul 29, 1945, 75 y.o.   MRN: [id number]   Chief Complaint  Patient presents with  . Leg Pain   Subjective:    HPI   Pt having right leg pain. Pt believes it is his knee, placed an ACE bandage on it and it helped. Twisting or putting pressure on knee causes pain. Pt needing dr excuse for work. Pain began Saturday, Sunday not so bad but Monday pain came back.   Pt walking on side of shoes.  Rt leg pain and knee pain. Started 4 days ago.  Walking a lot and walking to [employer].  Using crutches.   1-2 miles walking. Used to doing this 1-2x per week.  Ace bandage the thigh and then bandaged the knee.  Taking aleve 1-2 per day.  Standing 4-5 hrs per day working.  1-2 days a few months ago and resolved.   Medical History Anthony Glass has a past medical history of BPH (benign prostatic hypertrophy), Bumps on skin, Cancer ([place]), Diastolic dysfunction (0/86/5784), History of skin cancer, History of transfusion, Hydronephrosis of left kidney (12/13/2011), Hypothyroidism, and Nephrolithiasis.   Outpatient Encounter Medications as of 05/19/2020  Medication Sig  . amLODipine (NORVASC) 10 MG tablet TAKE 1 TABLET BY MOUTH EVERY DAY AT BEDTIME  . aspirin EC 325 MG tablet Take 325 mg by mouth daily as needed for mild pain. Reported on 06/08/2015  . MAGNESIUM PO Take 1 tablet by mouth every Monday, Wednesday, and Friday. He takes two per day.  . meloxicam (MOBIC) 7.5 MG tablet Take 1 tablet (7.5 mg total) by mouth daily.  . Multiple Vitamin (MULTIVITAMIN WITH MINERALS) TABS tablet Take 1 tablet by mouth daily. Reported on 06/08/2015  . polyvinyl alcohol (LIQUIFILM TEARS) 1.4 % ophthalmic solution Place 1 drop into both eyes 2 (two) times daily as needed for dry eyes. Reported on 06/08/2015   No facility-administered encounter medications on file as of 05/19/2020.     Review of Systems  Constitutional: Negative for chills and fever.  HENT: Negative for  congestion, rhinorrhea and sore throat.   Respiratory: Negative for cough, shortness of breath and wheezing.   Cardiovascular: Negative for chest pain and leg swelling.  Gastrointestinal: Negative for abdominal pain, diarrhea, nausea and vomiting.  Genitourinary: Negative for dysuria and frequency.  Musculoskeletal: Negative for arthralgias and back pain.       +rt leg and knee pain.  Skin: Negative for rash.  Neurological: Negative for dizziness, weakness and headaches.    Vitals BP 129/85   Pulse 66   Temp (!) 97.2 F (36.2 C)   Wt 194 lb 3.2 oz (88.1 kg)   SpO2 97%   BMI 25.62 kg/m   Objective:   Physical Exam Vitals and nursing note reviewed.  Constitutional:      General: He is not in acute distress.    Appearance: Normal appearance. He is not ill-appearing.  Cardiovascular:     Rate and Rhythm: Normal rate and regular rhythm.     Pulses: Normal pulses.     Heart sounds: Normal heart sounds.  Pulmonary:     Effort: Pulmonary effort is normal. No respiratory distress.     Breath sounds: Normal breath sounds.  Musculoskeletal:        General: Tenderness present. No swelling, deformity or signs of injury. Normal range of motion.     Right lower leg: No edema.     Left lower leg: No edema.  Comments: +ttp over rt medial knee and rt thigh.  Normal rom of rt knee  No erythema, warmth, swelling or rash. +pain with internal and external rot of rt hip.  Skin:    General: Skin is warm and dry.     Findings: No rash.  Neurological:     General: No focal deficit present.     Mental Status: He is alert and oriented to person, place, and time.  Psychiatric:        Mood and Affect: Mood normal.        Behavior: Behavior normal.        Thought Content: Thought content normal.        Judgment: Judgment normal.     Assessment and Plan   1. Right hip pain  2. Acute pain of right knee - DG Knee Complete 4 Views Right; Future   Use tylenol or mobic for pain.   Rest/ice elevation. Xray of rt knee and rt hip ordered.  Return if symptoms worsen or fail to improve.

## 2020-05-30 ENCOUNTER — Encounter: Payer: Self-pay | Admitting: Family Medicine

## 2020-06-25 ENCOUNTER — Telehealth: Payer: Self-pay | Admitting: Family Medicine

## 2020-06-25 NOTE — Telephone Encounter (Signed)
Left message for patient to schedule Annual Wellness Visit.  Please schedule with Nurse Health Advisor Shannon Crews, RN at [facility]  

## 2020-08-06 ENCOUNTER — Other Ambulatory Visit: Payer: Self-pay | Admitting: Family Medicine

## 2020-09-29 ENCOUNTER — Other Ambulatory Visit: Payer: Self-pay | Admitting: Family Medicine

## 2021-03-01 ENCOUNTER — Ambulatory Visit (INDEPENDENT_AMBULATORY_CARE_PROVIDER_SITE_OTHER): Payer: Medicare HMO | Admitting: Family Medicine

## 2021-03-01 ENCOUNTER — Encounter: Payer: Self-pay | Admitting: Family Medicine

## 2021-03-01 ENCOUNTER — Ambulatory Visit (HOSPITAL_COMMUNITY)
Admission: RE | Admit: 2021-03-01 | Discharge: 2021-03-01 | Disposition: A | Discharge status: Home / Self Care | Payer: Medicare HMO | Source: Ambulatory Visit | Attending: Family Medicine | Admitting: Family Medicine

## 2021-03-01 ENCOUNTER — Other Ambulatory Visit: Payer: Self-pay

## 2021-03-01 VITALS — BP 136/90 | HR 92 | Temp 97.6°F | Wt 189.2 lb

## 2021-03-01 DIAGNOSIS — M47816 Spondylosis without myelopathy or radiculopathy, lumbar region: Secondary | ICD-10-CM | POA: Diagnosis not present

## 2021-03-01 DIAGNOSIS — M79651 Pain in right thigh: Secondary | ICD-10-CM | POA: Insufficient documentation

## 2021-03-01 DIAGNOSIS — M545 Low back pain, unspecified: Secondary | ICD-10-CM | POA: Diagnosis not present

## 2021-03-01 NOTE — Patient Instructions (Signed)
Continue the aleve as needed.  I would recommend not using the wheelchair.   Xray today at the hospital.  Take care  Dr. Lacinda Axon

## 2021-03-02 DIAGNOSIS — M79651 Pain in right thigh: Secondary | ICD-10-CM | POA: Insufficient documentation

## 2021-03-02 NOTE — Assessment & Plan Note (Signed)
This is a chronic problem for the patient with acute exacerbation over the past 1.5 weeks.  Etiology is unclear at this time.  I have reviewed the x-rays of his right knee and his right hip.  There is no significant arthropathy.  X-ray lumbar spine and femur were obtained and were independently reviewed by me.  Interpretation: Normal x-ray of the femur.  Lumbar spine with spondylosis.  Suspect that this is referred pain from the low back or this is muscular.  Advised continued use of as needed naproxen.  If persists, will refer to orthopedics.

## 2021-03-02 NOTE — Progress Notes (Signed)
Subjective:  Patient ID: Anthony Glass, male    DOB: 1946-01-16  Age: 75 y.o. MRN: [id number]  CC: Chief Complaint  Patient presents with   Leg Pain    Pt having left groin pain. Pt also having knee pain that he states may or not be related. Pain began around 02/19/21. Pt missed work all last week. No swelling.     HPI:  75 year old male presents for evaluation of the above.  Patient arrives in a wheelchair.  Patient states that he is able to ambulate.  It is unclear why he is in a wheelchair.  He localizes the pain to the right inner thigh.  Patient states that he has had this intermittently for the past 6 months.  It has been bothering him once again for the past 1.5 weeks.  Patient believes that he has a "rolling pull".  He has been seen for this previously in March.  At that time x-rays were done of the knee as well as the hip.  Patient states that he has been taking Aleve with improvement.  No known inciting event.  Seems to be exacerbated by standing and walking.  Patient Active Problem List   Diagnosis Date Noted   Right thigh pain 03/02/2021   Essential hypertension 04/16/2018   Hyperlipidemia 09/08/2015   Renal stones     Social Hx   Social History   Socioeconomic History   Marital status: Divorced    Spouse name: Not on file   Number of children: Not on file   Years of education: Not on file   Highest education level: Not on file  Occupational History   Not on file  Tobacco Use   Smoking status: Former    Packs/day: 0.00    Years: 20.00    Pack years: 0.00    Types: Cigarettes    Quit date: 02/09/1982    Years since quitting: 39.0   Smokeless tobacco: Former    Quit date: 12/27/1981  Vaping Use   Vaping Use: Never used  Substance and Sexual Activity   Alcohol use: Yes    Comment: 1-2 drinks daily    Drug use: No   Sexual activity: Yes    Birth control/protection: None  Other Topics Concern   Not on file  Social History Narrative   Not on file    Social Determinants of Health   Financial Resource Strain: Not on file  Food Insecurity: Not on file  Transportation Needs: Not on file  Physical Activity: Not on file  Stress: Not on file  Social Connections: Not on file    Review of Systems Per HPI  Objective:  BP 136/90    Pulse 92    Temp 97.6 F (36.4 C)    Wt 189 lb 3.2 oz (85.8 kg)    SpO2 97%    BMI 24.96 kg/m   BP/Weight 03/01/2021 05/19/2020 92/01/9416  Systolic BP 408 144 818  Diastolic BP 90 85 80  Wt. (Lbs) 189.2 194.2 186  BMI 24.96 25.62 24.54    Physical Exam Constitutional:      General: He is not in acute distress.    Appearance: Normal appearance.  HENT:     Head: Normocephalic and atraumatic.  Eyes:     General:        Right eye: No discharge.        Left eye: No discharge.     Conjunctiva/sclera: Conjunctivae normal.  Pulmonary:     Effort: Pulmonary effort  is normal. No respiratory distress.  Musculoskeletal:     Comments: Patient able to get on the exam table without difficulty.  Ambulates down the hallway without difficulty.  On exam, he has pain with internal and external rotation of the hip.  Neurological:     Mental Status: He is alert.    Lab Results  Component Value Date   WBC 4.7 01/13/2020   HGB 13.8 01/13/2020   HCT 41.4 01/13/2020   PLT 265 01/13/2020   GLUCOSE 93 01/13/2020   CHOL 198 01/13/2020   TRIG 75 01/13/2020   HDL 55 01/13/2020   LDLCALC 129 (H) 01/13/2020   ALT 18 01/13/2020   AST 24 01/13/2020   NA 139 01/13/2020   K 4.9 01/13/2020   CL 105 01/13/2020   CREATININE 0.88 01/13/2020   BUN 20 01/13/2020   CO2 24 01/13/2020   TSH 0.998 04/01/2019   INR 1.04 02/18/2015   HGBA1C  03/17/2009    5.9 (NOTE) The ADA recommends the following therapeutic goal for glycemic control related to Hgb A1c measurement: Goal of therapy: <6.5 Hgb A1c  Reference: American Diabetes Association: Clinical Practice Recommendations 2010, Diabetes Care, 2010, 33: (Suppl  1).      Assessment & Plan:   Problem List Items Addressed This Visit       Other   Right thigh pain - Primary    This is a chronic problem for the patient with acute exacerbation over the past 1.5 weeks.  Etiology is unclear at this time.  I have reviewed the x-rays of his right knee and his right hip.  There is no significant arthropathy.  X-ray lumbar spine and femur were obtained and were independently reviewed by me.  Interpretation: Normal x-ray of the femur.  Lumbar spine with spondylosis.  Suspect that this is referred pain from the low back or this is muscular.  Advised continued use of as needed naproxen.  If persists, will refer to orthopedics.      Relevant Orders   DG FEMUR, MIN 2 VIEWS RIGHT (Completed)   DG Lumbar Spine Complete (Completed)    Anthony Glass Lacinda Axon DO [place]

## 2021-03-03 ENCOUNTER — Other Ambulatory Visit: Payer: Self-pay | Admitting: Family Medicine

## 2021-03-03 ENCOUNTER — Telehealth: Payer: Self-pay

## 2021-03-03 MED ORDER — TRAMADOL HCL 50 MG PO TABS: 50.0000 mg | ORAL_TABLET | Freq: Three times a day (TID) | ORAL | 0 refills | Status: DC | PRN

## 2021-03-03 NOTE — Telephone Encounter (Signed)
Patient was seen on 03/01/21 for R thigh pain, patient states he's been taking aleve, and the pain in his Right knee and groin area is not getting better, requesting if there is anything else that he can take for the pain / discomfort . Please advise

## 2021-03-16 ENCOUNTER — Telehealth: Payer: Self-pay | Admitting: Family Medicine

## 2021-03-16 NOTE — Telephone Encounter (Signed)
Left message for patient to call back and schedule Medicare Annual Wellness Visit (AWV) in office.   If unable to come into the office for AWV,  please offer to do virtually or by telephone.  Last AWV: 04/16/2019  Please schedule at anytime with [facility]-Nurse Health Advisor.  40 minute appointment  Any questions, please contact me at [phone]

## 2021-04-01 ENCOUNTER — Other Ambulatory Visit: Payer: Self-pay | Admitting: Family Medicine

## 2021-04-01 ENCOUNTER — Telehealth: Payer: Self-pay | Admitting: Family Medicine

## 2021-04-01 MED ORDER — MELOXICAM 15 MG PO TABS: 15.0000 mg | ORAL_TABLET | Freq: Every day | ORAL | 0 refills | Status: DC | PRN

## 2021-04-01 NOTE — Telephone Encounter (Signed)
Patient is requesting something for leg pain.[facility]

## 2021-04-01 NOTE — Telephone Encounter (Signed)
Pt returned call. Pt states the pain is intense. Asked pt where the pain was located but no answer from pt; pt states that he found some Meloxicam 7.5 mg that was prescribed back in December and that did help but ran out of it today. Pt also taking Tylenol. Pt would like refill on Meloxicam. Please advise. Thank you

## 2021-04-01 NOTE — Telephone Encounter (Signed)
Left message to return call to get additional information

## 2021-04-01 NOTE — Telephone Encounter (Signed)
Pt contacted and verbalized understanding.  

## 2021-05-02 ENCOUNTER — Ambulatory Visit (INDEPENDENT_AMBULATORY_CARE_PROVIDER_SITE_OTHER): Payer: Medicare HMO | Admitting: Family Medicine

## 2021-05-02 ENCOUNTER — Other Ambulatory Visit: Payer: Self-pay

## 2021-05-02 ENCOUNTER — Encounter: Payer: Self-pay | Admitting: Family Medicine

## 2021-05-02 DIAGNOSIS — M79651 Pain in right thigh: Secondary | ICD-10-CM | POA: Diagnosis not present

## 2021-05-02 NOTE — Patient Instructions (Signed)
Continue to use the tylenol as needed.  Follow up in 6 months.  Take care  Dr. Lacinda Axon

## 2021-05-02 NOTE — Assessment & Plan Note (Addendum)
Improved.  Patient does not want to continue use of meloxicam.  Advised that this is okay.  Discontinuing meloxicam today.  Tylenol as needed.

## 2021-05-02 NOTE — Progress Notes (Signed)
Subjective:  Patient ID: Anthony Glass, male    DOB: 1945-07-23  Age: 76 y.o. MRN: [id number]  CC: Chief Complaint  Patient presents with   Discuss Meds    Would like to quit taking Meloxicam     HPI:  76 year old male presents for evaluation the above.  Patient has had some ongoing issues with high pain.  X-ray imaging was unremarkable.  He was initially treated with tramadol and then subsequently with meloxicam.  Pain has improved.  He has some residual pain but overall has had significant improvement.  He wishes to discontinue meloxicam.  He would like to discuss this today.  Patient Active Problem List   Diagnosis Date Noted   Right thigh pain 03/02/2021   Essential hypertension 04/16/2018   Hyperlipidemia 09/08/2015   Renal stones     Social Hx   Social History   Socioeconomic History   Marital status: Divorced    Spouse name: Not on file   Number of children: Not on file   Years of education: Not on file   Highest education level: Not on file  Occupational History   Not on file  Tobacco Use   Smoking status: Former    Packs/day: 0.00    Years: 20.00    Pack years: 0.00    Types: Cigarettes    Quit date: 02/09/1982    Years since quitting: 39.2   Smokeless tobacco: Former    Quit date: 12/27/1981  Vaping Use   Vaping Use: Never used  Substance and Sexual Activity   Alcohol use: Yes    Comment: 1-2 drinks daily    Drug use: No   Sexual activity: Yes    Birth control/protection: None  Other Topics Concern   Not on file  Social History Narrative   Not on file   Social Determinants of Health   Financial Resource Strain: Not on file  Food Insecurity: Not on file  Transportation Needs: Not on file  Physical Activity: Not on file  Stress: Not on file  Social Connections: Not on file    Review of Systems Per HPI  Objective:  BP 122/84    Pulse (!) 109    Temp 98.1 F (36.7 C)    Wt 182 lb 6.4 oz (82.7 kg)    SpO2 96%    BMI 24.06 kg/m    BP/Weight 05/02/2021 70/03/7492 06/26/6757  Systolic BP 163 846 659  Diastolic BP 84 90 85  Wt. (Lbs) 182.4 189.2 194.2  BMI 24.06 24.96 25.62    Physical Exam Constitutional:      General: He is not in acute distress.    Appearance: Normal appearance. He is not ill-appearing.  Eyes:     General:        Right eye: No discharge.        Left eye: No discharge.     Conjunctiva/sclera: Conjunctivae normal.  Pulmonary:     Effort: Pulmonary effort is normal. No respiratory distress.  Neurological:     Mental Status: He is alert.  Psychiatric:        Mood and Affect: Mood normal.        Behavior: Behavior normal.    Lab Results  Component Value Date   WBC 4.7 01/13/2020   HGB 13.8 01/13/2020   HCT 41.4 01/13/2020   PLT 265 01/13/2020   GLUCOSE 93 01/13/2020   CHOL 198 01/13/2020   TRIG 75 01/13/2020   HDL 55 01/13/2020   LDLCALC 129 (H)  01/13/2020   ALT 18 01/13/2020   AST 24 01/13/2020   NA 139 01/13/2020   K 4.9 01/13/2020   CL 105 01/13/2020   CREATININE 0.88 01/13/2020   BUN 20 01/13/2020   CO2 24 01/13/2020   TSH 0.998 04/01/2019   INR 1.04 02/18/2015   HGBA1C  03/17/2009    5.9 (NOTE) The ADA recommends the following therapeutic goal for glycemic control related to Hgb A1c measurement: Goal of therapy: <6.5 Hgb A1c  Reference: American Diabetes Association: Clinical Practice Recommendations 2010, Diabetes Care, 2010, 33: (Suppl  1).     Assessment & Plan:   Problem List Items Addressed This Visit       Other   Right thigh pain    Improved.  Patient does not want to continue use of meloxicam.  Advised that this is okay.  Discontinuing meloxicam today.  Tylenol as needed.      Follow-up:  Return in about 6 months (around 10/30/2021) for Follow up Chronic medical issues.  [place]

## 2021-05-15 ENCOUNTER — Other Ambulatory Visit: Payer: Self-pay | Admitting: Family Medicine

## 2021-05-17 ENCOUNTER — Other Ambulatory Visit: Payer: Self-pay | Admitting: Family Medicine

## 2021-05-17 ENCOUNTER — Telehealth: Payer: Self-pay | Admitting: Family Medicine

## 2021-05-17 NOTE — Telephone Encounter (Signed)
Patient called to follow up on refill request of amLODipine (NORVASC) 10 MG tablet [[id number]]   States he was unable to get anyone on the phone at the pharmacy; automated system says Rx not received.  Please advise at [phone].

## 2021-05-17 NOTE — Telephone Encounter (Signed)
[facility] states the medication has been received, filled and is ready for pick up.  Patient notified

## 2021-08-12 ENCOUNTER — Other Ambulatory Visit: Payer: Self-pay | Admitting: Family Medicine

## 2021-10-06 DIAGNOSIS — H401123 Primary open-angle glaucoma, left eye, severe stage: Secondary | ICD-10-CM | POA: Diagnosis not present

## 2021-10-28 DIAGNOSIS — H25811 Combined forms of age-related cataract, right eye: Secondary | ICD-10-CM | POA: Diagnosis not present

## 2021-10-28 DIAGNOSIS — H401123 Primary open-angle glaucoma, left eye, severe stage: Secondary | ICD-10-CM | POA: Diagnosis not present

## 2021-10-28 DIAGNOSIS — H25812 Combined forms of age-related cataract, left eye: Secondary | ICD-10-CM | POA: Diagnosis not present

## 2021-10-31 ENCOUNTER — Ambulatory Visit (INDEPENDENT_AMBULATORY_CARE_PROVIDER_SITE_OTHER): Payer: Medicare HMO | Admitting: Family Medicine

## 2021-10-31 VITALS — BP 130/82 | Ht 73.0 in | Wt 188.2 lb

## 2021-10-31 DIAGNOSIS — Z130 Encounter for screening for diseases of the blood and blood-forming organs and certain disorders involving the immune mechanism: Secondary | ICD-10-CM | POA: Diagnosis not present

## 2021-10-31 DIAGNOSIS — E785 Hyperlipidemia, unspecified: Secondary | ICD-10-CM | POA: Diagnosis not present

## 2021-10-31 DIAGNOSIS — I10 Essential (primary) hypertension: Secondary | ICD-10-CM | POA: Diagnosis not present

## 2021-10-31 DIAGNOSIS — M79651 Pain in right thigh: Secondary | ICD-10-CM

## 2021-10-31 NOTE — Patient Instructions (Signed)
Consider seeing Orthopedics (I'm happy to put in a referral).  Labs when you can.  Follow up in 6 months.  Take care  Dr. Lacinda Axon

## 2021-11-01 LAB — LIPID PANEL
Chol/HDL Ratio: 4.2 ratio (ref 0.0–5.0)
Cholesterol, Total: 209 mg/dL — ABNORMAL HIGH (ref 100–199)
HDL: 50 mg/dL (ref >39)
LDL Chol Calc (NIH): 135 mg/dL — ABNORMAL HIGH (ref 0–99)
Triglycerides: 135 mg/dL (ref 0–149)
VLDL Cholesterol Cal: 24 mg/dL (ref 5–40)

## 2021-11-01 LAB — CMP14+EGFR
ALT: 14 IU/L (ref 0–44)
AST: 22 IU/L (ref 0–40)
Albumin/Globulin Ratio: 2.2 (ref 1.2–2.2)
Albumin: 4.4 g/dL (ref 3.8–4.8)
Alkaline Phosphatase: 69 IU/L (ref 44–121)
BUN/Creatinine Ratio: 14 (ref 10–24)
BUN: 15 mg/dL (ref 8–27)
Bilirubin Total: 0.3 mg/dL (ref 0.0–1.2)
CO2: 21 mmol/L (ref 20–29)
Calcium: 9.8 mg/dL (ref 8.6–10.2)
Chloride: 101 mmol/L (ref 96–106)
Creatinine, Ser: 1.09 mg/dL (ref 0.76–1.27)
Globulin, Total: 2 g/dL (ref 1.5–4.5)
Glucose: 103 mg/dL — ABNORMAL HIGH (ref 70–99)
Potassium: 4.7 mmol/L (ref 3.5–5.2)
Sodium: 138 mmol/L (ref 134–144)
Total Protein: 6.4 g/dL (ref 6.0–8.5)
eGFR: 70 mL/min/{1.73_m2} (ref >59)

## 2021-11-01 LAB — CBC
Hematocrit: 43.2 % (ref 37.5–51.0)
Hemoglobin: 14.4 g/dL (ref 13.0–17.7)
MCH: 31 pg (ref 26.6–33.0)
MCHC: 33.3 g/dL (ref 31.5–35.7)
MCV: 93 fL (ref 79–97)
Platelets: 233 10*3/uL (ref 150–450)
RBC: 4.65 x10E6/uL (ref 4.14–5.80)
RDW: 13.7 % (ref 11.6–15.4)
WBC: 4.5 10*3/uL (ref 3.4–10.8)

## 2021-11-01 NOTE — Assessment & Plan Note (Signed)
Stable.  Continue amlodipine.  We will keep an eye on lower extremity edema.

## 2021-11-01 NOTE — Progress Notes (Signed)
Subjective:  Patient ID: Anthony Glass, male    DOB: [birth year]  Age: 77 y.o. MRN: [id number]  CC: Chief Complaint  Patient presents with   Hypertension    Follow up Ongoing issues with leg    HPI:  76 year old male with hypertension, history of nephrolithiasis, hyperlipidemia, ongoing right thigh pain of uncertain etiology presents for follow-up  Hypertension is well controlled on amlodipine.  Patient is not on any pharmacotherapy regarding his lipids.  Needs labs.  Patient reports that he has had ongoing low back pain.  He also has intermittent medial right thigh pain.  Work-up has been unrevealing except for lumbar spondylosis.  He uses Tylenol and Aleve with good response.  He reports that lately he is experiencing lower extremity edema.  No chest pain or shortness of breath.  Patient Active Problem List   Diagnosis Date Noted   Right thigh pain 03/02/2021   Essential hypertension 04/16/2018   Hyperlipidemia 09/08/2015   Renal stones     Social Hx   Social History   Socioeconomic History   Marital status: Divorced    Spouse name: Not on file   Number of children: Not on file   Years of education: Not on file   Highest education level: Not on file  Occupational History   Not on file  Tobacco Use   Smoking status: Former    Packs/day: 0.00    Years: 20.00    Total pack years: 0.00    Types: Cigarettes    Quit date: 02/09/1982    Years since quitting: 39.7   Smokeless tobacco: Former    Quit date: 12/27/1981  Vaping Use   Vaping Use: Never used  Substance and Sexual Activity   Alcohol use: Yes    Comment: 1-2 drinks daily    Drug use: No   Sexual activity: Yes    Birth control/protection: None  Other Topics Concern   Not on file  Social History Narrative   Not on file   Social Determinants of Health   Financial Resource Strain: Not on file  Food Insecurity: Not on file  Transportation Needs: Not on file  Physical Activity: Not on file   Stress: Not on file  Social Connections: Not on file    Review of Systems Per HPI  Objective:  BP 130/82   Ht '6\' 1"'  (1.854 m)   Wt 188 lb 3.2 oz (85.4 kg)   BMI 24.83 kg/m      10/31/2021    2:05 PM 05/02/2021    1:56 PM 03/01/2021    1:26 PM  BP/Weight  Systolic BP 932 671 245  Diastolic BP 82 84 90  Wt. (Lbs) 188.2 182.4 189.2  BMI 24.83 kg/m2 24.06 kg/m2 24.96 kg/m2    Physical Exam Vitals and nursing note reviewed.  Constitutional:      General: He is not in acute distress.    Appearance: He is not ill-appearing.  HENT:     Head: Normocephalic and atraumatic.  Eyes:     General:        Right eye: No discharge.        Left eye: No discharge.     Conjunctiva/sclera: Conjunctivae normal.  Cardiovascular:     Rate and Rhythm: Normal rate and regular rhythm.     Comments: 1+ lower extremity edema. Pulmonary:     Effort: Pulmonary effort is normal.     Breath sounds: Normal breath sounds. No wheezing, rhonchi or rales.  Abdominal:  General: There is no distension.     Palpations: Abdomen is soft.     Tenderness: There is no abdominal tenderness.  Neurological:     Mental Status: He is alert.  Psychiatric:     Comments: Flat affect.      Lab Results  Component Value Date   WBC 4.5 10/31/2021   HGB 14.4 10/31/2021   HCT 43.2 10/31/2021   PLT 233 10/31/2021   GLUCOSE 103 (H) 10/31/2021   CHOL 209 (H) 10/31/2021   TRIG 135 10/31/2021   HDL 50 10/31/2021   LDLCALC 135 (H) 10/31/2021   ALT 14 10/31/2021   AST 22 10/31/2021   NA 138 10/31/2021   K 4.7 10/31/2021   CL 101 10/31/2021   CREATININE 1.09 10/31/2021   BUN 15 10/31/2021   CO2 21 10/31/2021   TSH 0.998 04/01/2019   INR 1.04 02/18/2015   HGBA1C  03/17/2009    5.9 (NOTE) The ADA recommends the following therapeutic goal for glycemic control related to Hgb A1c measurement: Goal of therapy: <6.5 Hgb A1c  Reference: American Diabetes Association: Clinical Practice Recommendations 2010,  Diabetes Care, 2010, 33: (Suppl  1).     Assessment & Plan:   Problem List Items Addressed This Visit       Cardiovascular and Mediastinum   Essential hypertension - Primary    Stable.  Continue amlodipine.  We will keep an eye on lower extremity edema.      Relevant Orders   CMP14+EGFR (Completed)     Other   Hyperlipidemia    Lipid panel has returned.  LDL elevated.  Consider lipid-lowering therapy.      Relevant Orders   Lipid panel (Completed)   Right thigh pain    Offered referral to orthopedics.  Patient states that he will contemplate.  Can continue to use Tylenol.  Use NSAIDs sparingly.      Other Visit Diagnoses     Screening for deficiency anemia       Relevant Orders   CBC (Completed)       Follow-up:  Return in about 6 months (around 05/03/2022).  [place]

## 2021-11-01 NOTE — Assessment & Plan Note (Signed)
Lipid panel has returned.  LDL elevated.  Consider lipid-lowering therapy.

## 2021-11-01 NOTE — Assessment & Plan Note (Signed)
Offered referral to orthopedics.  Patient states that he will contemplate.  Can continue to use Tylenol.  Use NSAIDs sparingly.

## 2021-11-09 ENCOUNTER — Other Ambulatory Visit: Payer: Self-pay | Admitting: Family Medicine

## 2021-11-09 MED ORDER — ROSUVASTATIN CALCIUM 10 MG PO TABS: 10.0000 mg | ORAL_TABLET | Freq: Every day | ORAL | 0 refills | Status: DC

## 2021-12-26 ENCOUNTER — Emergency Department (HOSPITAL_COMMUNITY)
Admission: EM | Admit: 2021-12-26 | Discharge: 2021-12-26 | Disposition: A | Discharge status: Home / Self Care | Payer: Worker's Compensation | Attending: Emergency Medicine | Admitting: Emergency Medicine

## 2021-12-26 ENCOUNTER — Encounter (HOSPITAL_COMMUNITY): Payer: Self-pay | Admitting: Emergency Medicine

## 2021-12-26 ENCOUNTER — Other Ambulatory Visit: Payer: Self-pay

## 2021-12-26 ENCOUNTER — Emergency Department (HOSPITAL_COMMUNITY): Payer: Worker's Compensation

## 2021-12-26 DIAGNOSIS — S0291XA Unspecified fracture of skull, initial encounter for closed fracture: Secondary | ICD-10-CM | POA: Diagnosis not present

## 2021-12-26 DIAGNOSIS — R791 Abnormal coagulation profile: Secondary | ICD-10-CM | POA: Diagnosis not present

## 2021-12-26 DIAGNOSIS — R0689 Other abnormalities of breathing: Secondary | ICD-10-CM | POA: Diagnosis not present

## 2021-12-26 DIAGNOSIS — R58 Hemorrhage, not elsewhere classified: Secondary | ICD-10-CM | POA: Diagnosis not present

## 2021-12-26 DIAGNOSIS — W11XXXA Fall on and from ladder, initial encounter: Secondary | ICD-10-CM | POA: Diagnosis not present

## 2021-12-26 DIAGNOSIS — W19XXXA Unspecified fall, initial encounter: Secondary | ICD-10-CM

## 2021-12-26 DIAGNOSIS — I10 Essential (primary) hypertension: Secondary | ICD-10-CM | POA: Diagnosis not present

## 2021-12-26 DIAGNOSIS — Y9389 Activity, other specified: Secondary | ICD-10-CM | POA: Insufficient documentation

## 2021-12-26 DIAGNOSIS — S0990XA Unspecified injury of head, initial encounter: Secondary | ICD-10-CM | POA: Diagnosis not present

## 2021-12-26 DIAGNOSIS — E039 Hypothyroidism, unspecified: Secondary | ICD-10-CM | POA: Diagnosis not present

## 2021-12-26 DIAGNOSIS — R55 Syncope and collapse: Secondary | ICD-10-CM | POA: Diagnosis not present

## 2021-12-26 DIAGNOSIS — S0101XA Laceration without foreign body of scalp, initial encounter: Secondary | ICD-10-CM | POA: Insufficient documentation

## 2021-12-26 DIAGNOSIS — Z743 Need for continuous supervision: Secondary | ICD-10-CM | POA: Diagnosis not present

## 2021-12-26 DIAGNOSIS — Z79899 Other long term (current) drug therapy: Secondary | ICD-10-CM | POA: Insufficient documentation

## 2021-12-26 LAB — CBC WITH DIFFERENTIAL/PLATELET
Abs Immature Granulocytes: 0.02 10*3/uL (ref 0.00–0.07)
Basophils Absolute: 0 10*3/uL (ref 0.0–0.1)
Basophils Relative: 0 %
Eosinophils Absolute: 0.1 10*3/uL (ref 0.0–0.5)
Eosinophils Relative: 1 %
HCT: 43.4 % (ref 39.0–52.0)
Hemoglobin: 14.7 g/dL (ref 13.0–17.0)
Immature Granulocytes: 0 %
Lymphocytes Relative: 11 %
Lymphs Abs: 0.8 10*3/uL (ref 0.7–4.0)
MCH: 31.9 pg (ref 26.0–34.0)
MCHC: 33.9 g/dL (ref 30.0–36.0)
MCV: 94.1 fL (ref 80.0–100.0)
Monocytes Absolute: 0.5 10*3/uL (ref 0.1–1.0)
Monocytes Relative: 8 %
Neutro Abs: 5.6 10*3/uL (ref 1.7–7.7)
Neutrophils Relative %: 80 %
Platelets: 227 10*3/uL (ref 150–400)
RBC: 4.61 MIL/uL (ref 4.22–5.81)
RDW: 13.4 % (ref 11.5–15.5)
WBC: 7 10*3/uL (ref 4.0–10.5)
nRBC: 0 % (ref 0.0–0.2)

## 2021-12-26 LAB — BASIC METABOLIC PANEL
Anion gap: 9 (ref 5–15)
BUN: 24 mg/dL — ABNORMAL HIGH (ref 8–23)
CO2: 28 mmol/L (ref 22–32)
Calcium: 10.2 mg/dL (ref 8.9–10.3)
Chloride: 103 mmol/L (ref 98–111)
Creatinine, Ser: 0.88 mg/dL (ref 0.61–1.24)
GFR, Estimated: >60 mL/min (ref >60)
Glucose, Bld: 97 mg/dL (ref 70–99)
Potassium: 4.7 mmol/L (ref 3.5–5.1)
Sodium: 140 mmol/L (ref 135–145)

## 2021-12-26 LAB — PROTIME-INR
INR: 1 (ref 0.8–1.2)
Prothrombin Time: 13.3 seconds (ref 11.4–15.2)

## 2021-12-26 MED ORDER — LIDOCAINE HCL (PF) 2 % IJ SOLN
INTRAMUSCULAR | Status: AC
  Administered 2021-12-26: 5 mL via INTRADERMAL
  Filled 2021-12-26: qty 10

## 2021-12-26 MED ORDER — LIDOCAINE HCL (PF) 2 % IJ SOLN: 5.0000 mL | Freq: Once | INTRAMUSCULAR | Status: AC

## 2021-12-26 MED ORDER — ACETAMINOPHEN 325 MG PO TABS
650.0000 mg | ORAL_TABLET | Freq: Once | ORAL | Status: AC
  Administered 2021-12-26: 650 mg via ORAL
  Filled 2021-12-26: qty 2

## 2021-12-26 MED ORDER — POVIDONE-IODINE 10 % EX SOLN: CUTANEOUS | Status: DC | PRN

## 2021-12-26 NOTE — ED Triage Notes (Signed)
Per Ems pt reports falling backwards off of a ladder. Pt reports he had had 1 foot on the ground and lost his balance, fell backwards, and hit his head. EMS reports LOC x 3-4 minutes. Pt refused C-collar en route. Denies blood thinners. Pt is alert and oriented x 4.

## 2021-12-26 NOTE — ED Notes (Signed)
Pt ambulatory to waiting room. Pt verbalized understanding of discharge instructions.   

## 2021-12-26 NOTE — Discharge Instructions (Signed)
You suffered a head injury today with a laceration of your scalp.  This has been repaired with staples.  Keep the wound clean with mild soap and water.  Staples will need to be removed in 7 days.  Return here for any signs of infection.  I recommend rest without strenuous lifting or bending for at least 1 week.  You may take Tylenol or ibuprofen if needed for headache.  Please call your primary care provider to arrange a follow-up appointment in the next few days.  Return to the emergency department for any new or worsening symptoms.

## 2021-12-26 NOTE — ED Provider Notes (Signed)
[facility] EMERGENCY DEPARTMENT Provider Note   CSN: [id number] Arrival date & time: 12/26/21  1203     History  Chief Complaint  Patient presents with   Lytle Michaels    Anthony Glass is a 76 y.o. male.   Fall Associated symptoms include headaches. Pertinent negatives include no chest pain, no abdominal pain and no shortness of breath.       Anthony Glass is a 76 y.o. male with past medical history of BPH, kidney stones, hypothyroidism and hypertension who presents to the Emergency Department complaining of mechanical fall that occurred shortly before ER arrival.  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  He went to step off the ladder and fell backwards striking the right side of his head on the floor.  He reports a brief episode of a few seconds in which he lost consciousness and has a laceration to the right scalp.  Per EMS, patient had LOC of 3 to 4 minutes.  He complains of numbness to the right ear and right neck area.  Some pain of his right neck that has since resolved.  Patient declined c-collar placement per EMS.  He intermittently takes 81 mg aspirin, but has not taken since last week.  He denies any visual changes, nausea, vomiting, dizziness, low back pain, rib pain or abdominal pain.  He also denies any pain of his lower extremities.  Home Medications Prior to Admission medications   Medication Sig Start Date End Date Taking? Authorizing Provider  amLODipine (NORVASC) 10 MG tablet TAKE 1 TABLET BY MOUTH ONCE DAILY AT BEDTIME 11/09/21   Coral Spikes, DO  aspirin EC 325 MG tablet Take 325 mg by mouth daily as needed for mild pain. Reported on 06/08/2015    [provider]  MAGNESIUM PO Take 1 tablet by mouth every Monday, Wednesday, and Friday. He takes two per day.    [provider]  Multiple Vitamin (MULTIVITAMIN WITH MINERALS) TABS tablet  Take 1 tablet by mouth daily. Reported on 06/08/2015    [provider]  polyvinyl alcohol (LIQUIFILM TEARS) 1.4 % ophthalmic solution Place 1 drop into both eyes 2 (two) times daily as needed for dry eyes. Reported on 06/08/2015    [provider]  rosuvastatin (CRESTOR) 10 MG tablet Take 1 tablet (10 mg total) by mouth daily. 11/09/21   Coral Spikes, DO      Allergies    Patient has no known allergies.    Review of Systems   Review of Systems  Constitutional:  Negative for fever.  Eyes:  Negative for pain and visual disturbance.  Respiratory:  Negative for shortness of breath.   Cardiovascular:  Negative for chest pain.  Gastrointestinal:  Negative for abdominal pain, diarrhea, nausea and vomiting.  Musculoskeletal:  Positive for neck pain. Negative for arthralgias and back pain.  Skin:  Positive for wound.       Right scalp laceration  Neurological:  Positive for syncope, numbness (right ear and right neck) and headaches. Negative for dizziness and weakness.  Psychiatric/Behavioral:  Negative for confusion.     Physical Exam Updated Vital Signs BP 135/88   Pulse (!) 57   Temp 98.2 F (36.8 C) (Oral)   Resp 15   SpO2 95%  Physical Exam Vitals and nursing note reviewed.  Constitutional:      General: He is not  in acute distress.    Appearance: Normal appearance. He is not ill-appearing or toxic-appearing.  HENT:     Head: No Battle's sign.     Jaw: There is normal jaw occlusion.     Comments: 3 cm laceration to the parietal scalp.  No hematoma    Right Ear: Tympanic membrane and ear canal normal. No hemotympanum.     Left Ear: Tympanic membrane and ear canal normal. No hemotympanum.     Ears:     Comments: No hemotympanum    Mouth/Throat:     Mouth: Mucous membranes are moist.     Pharynx: Oropharynx is clear.  Eyes:     Extraocular Movements: Extraocular movements intact.     Conjunctiva/sclera: Conjunctivae normal.     Pupils: Pupils are equal,  round, and reactive to light.  Cardiovascular:     Rate and Rhythm: Normal rate and regular rhythm.     Pulses: Normal pulses.  Pulmonary:     Effort: Pulmonary effort is normal.     Breath sounds: Normal breath sounds.  Chest:     Chest wall: No tenderness.  Abdominal:     Palpations: Abdomen is soft.     Tenderness: There is no abdominal tenderness.  Musculoskeletal:        General: Normal range of motion.     Cervical back: Normal range of motion. No tenderness.  Skin:    General: Skin is warm.     Capillary Refill: Capillary refill takes less than 2 seconds.  Neurological:     General: No focal deficit present.     Mental Status: He is alert.     GCS: GCS eye subscore is 4. GCS verbal subscore is 5. GCS motor subscore is 6.     Sensory: Sensation is intact. No sensory deficit.     Motor: Motor function is intact. No weakness.     Coordination: Coordination is intact.     Comments: CN II through XII intact.  Speech clear.  Follows commands appropriately.     ED Results / Procedures / Treatments   Labs (all labs ordered are listed, but only abnormal results are displayed) Labs Reviewed - No data to display  EKG None  Radiology No results found.  Procedures Procedures     LACERATION REPAIR Performed by: Staley Lunz Authorized by: Macari Zalesky Consent: Verbal consent obtained. Risks and benefits: risks, benefits and alternatives were discussed Consent given by: patient Patient identity confirmed: provided demographic data Prepped and Draped in normal sterile fashion Wound explored  Laceration Location: right scalp  Laceration Length: 3 cm  No Foreign Bodies seen or palpated  Anesthesia: local infiltration  Local anesthetic: lidocaine 2% w/o epinephrine  Anesthetic total: 3 ml  Irrigation method: syringe Amount of cleaning: standard  Skin closure: staples  Number of staples: 7  Technique: stapling  Patient tolerance: Patient tolerated  the procedure well with no immediate complications.   Medications Ordered in ED Medications - No data to display  ED Course/ Medical Decision Making/ A&P                           Medical Decision Making Patient here for evaluation of head injury and laceration of the scalp after mechanical fall that occurred shortly before arrival.  Standing on the second rung of a ladder stepped backwards taking he was stepping off onto the floor and fell striking his head.  Intermittently takes 81 mg aspirin.  Endorses brief episode of LOC.  Has numbness of his lower right ear and lateral neck.  Denies visual changes dizziness or vomiting.  Some neck pain at onset but denies neck pain at present, no midline tenderness  On exam, patient alert oriented x3.  No focal neurodeficits.  He has a 3 cm laceration to the parietal scalp without hematoma.  No active bleeding.  Work-up to include CT imaging of the head and C-spine we will also obtain labs.  Differential includes but not limited to concussion, skull fracture, neurologic injury, subdural hematoma, cervical injury  Amount and/or Complexity of Data Reviewed Labs: ordered.    Details: Labs interpreted by me, no evidence of leukocytosis, hemoglobin unremarkable.  INR, chemistries without significant derangement. Radiology: ordered.    Details: CT imaging of the head and cervical spine without acute bony or intracranial findings. Discussion of management or test interpretation with external provider(s): On recheck, patient resting comfortably remains neurovascularly intact.  Initial complaint of numbness of the ear and lateral neck improved.  Continues to have mild headache for which Tylenol was administered.  He is ambulatory in the department with steady gait.  No focal neurodeficits. Work-up reassuring.  Patient likely has mild concussion.  Verbalized understanding of this, given head injury instructions.  Laceration cleaned and well approximated by me  using staples.  He appears appropriate for discharge home, all questions were answered.  He will follow-up closely with PCP.  Return precautions were discussed.  Risk OTC drugs. Prescription drug management.           Final Clinical Impression(s) / ED Diagnoses Final diagnoses:  Fall, initial encounter  Laceration of scalp, initial encounter  Injury of head, initial encounter    Rx / DC Orders ED Discharge Orders     None         Kem Parkinson, PA-C 12/29/21 1638    Fredia Sorrow, MD 12/30/21 [phone]

## 2022-01-02 ENCOUNTER — Other Ambulatory Visit: Payer: Self-pay

## 2022-01-02 ENCOUNTER — Encounter (HOSPITAL_COMMUNITY): Payer: Self-pay

## 2022-01-02 ENCOUNTER — Emergency Department (HOSPITAL_COMMUNITY)
Admission: EM | Admit: 2022-01-02 | Discharge: 2022-01-02 | Disposition: A | Discharge status: Home / Self Care | Payer: Worker's Compensation | Attending: Emergency Medicine | Admitting: Emergency Medicine

## 2022-01-02 DIAGNOSIS — Z4802 Encounter for removal of sutures: Secondary | ICD-10-CM | POA: Diagnosis not present

## 2022-01-02 DIAGNOSIS — S0101XD Laceration without foreign body of scalp, subsequent encounter: Secondary | ICD-10-CM | POA: Diagnosis not present

## 2022-01-02 DIAGNOSIS — W0110XD Fall on same level from slipping, tripping and stumbling with subsequent striking against unspecified object, subsequent encounter: Secondary | ICD-10-CM | POA: Insufficient documentation

## 2022-01-02 DIAGNOSIS — Z79899 Other long term (current) drug therapy: Secondary | ICD-10-CM | POA: Diagnosis not present

## 2022-01-02 NOTE — ED Provider Notes (Signed)
[facility] EMERGENCY DEPARTMENT Provider Note   CSN: [id number] Arrival date & time: 01/02/22  1307     History  Chief Complaint  Patient presents with   Suture / Staple Removal    Anthony Glass is a 76 y.o. male.  Patient with history of hypertension presents today requesting staple removal. States that 7 days ago he fell and hit his head and had a laceration to the right side of his scalp that had 7 staples placed at that time. Since then he has had no complications from the fall or any issues with the wound. Denies fevers, chills, headache, or drainage from the wound.  The history is provided by the patient. No language interpreter was used.  Suture / Staple Removal       Home Medications Prior to Admission medications   Medication Sig Start Date End Date Taking? Authorizing Provider  amLODipine (NORVASC) 10 MG tablet TAKE 1 TABLET BY MOUTH ONCE DAILY AT BEDTIME 11/09/21   Coral Spikes, DO  aspirin EC 325 MG tablet Take 325 mg by mouth daily as needed for mild pain. Reported on 06/08/2015    [provider]  MAGNESIUM PO Take 1 tablet by mouth every Monday, Wednesday, and Friday. He takes two per day.    [provider]  Multiple Vitamin (MULTIVITAMIN WITH MINERALS) TABS tablet Take 1 tablet by mouth daily. Reported on 06/08/2015    [provider]  polyvinyl alcohol (LIQUIFILM TEARS) 1.4 % ophthalmic solution Place 1 drop into both eyes 2 (two) times daily as needed for dry eyes. Reported on 06/08/2015    [provider]  rosuvastatin (CRESTOR) 10 MG tablet Take 1 tablet (10 mg total) by mouth daily. 11/09/21   Coral Spikes, DO      Allergies    Patient has no known allergies.    Review of Systems   Review of Systems  Skin:  Positive for wound.  All other systems reviewed and are negative.   Physical Exam Updated Vital Signs BP 124/88 (BP Location: Right Arm)   Pulse (!) 57   Temp 98.1 F (36.7 C) (Oral)   Resp 20   Ht '6\' 1"'$   (1.854 m)   Wt 80.7 kg   SpO2 93%   BMI 23.48 kg/m  Physical Exam Vitals and nursing note reviewed.  Constitutional:      General: He is not in acute distress.    Appearance: Normal appearance. He is normal weight. He is not ill-appearing, toxic-appearing or diaphoretic.  HENT:     Head:     Comments: 7 staples in place to right scalp wound. Wound appears to be healing well with no dehiscence or drainage. No tenderness, crepitus, or fluctuance. Cardiovascular:     Rate and Rhythm: Normal rate.  Pulmonary:     Effort: Pulmonary effort is normal. No respiratory distress.  Musculoskeletal:        General: Normal range of motion.     Cervical back: Normal range of motion.  Skin:    General: Skin is warm and dry.  Neurological:     General: No focal deficit present.     Mental Status: He is alert.  Psychiatric:        Mood and Affect: Mood normal.        Behavior: Behavior normal.     ED Results / Procedures / Treatments   Labs (all labs ordered are listed, but only abnormal results are displayed) Labs Reviewed - No data  to display  EKG None  Radiology No results found.  Procedures .Suture Removal  Date/Time: 01/02/2022 3:10 PM  Performed by: Bud Face, PA-C Authorized by: Bud Face, PA-C   Consent:    Consent obtained:  Verbal   Consent given by:  Patient   Risks, benefits, and alternatives were discussed: yes     Risks discussed:  Bleeding, pain and wound separation   Alternatives discussed:  No treatment, delayed treatment, alternative treatment, observation and referral Universal protocol:    Procedure explained and questions answered to patient or proxy's satisfaction: yes     Patient identity confirmed:  Verbally with patient Location:    Location:  Head/neck   Head/neck location:  Scalp Procedure details:    Wound appearance:  No signs of infection, good wound healing and clean   Number of staples removed:  7 Post-procedure details:     Post-removal:  No dressing applied   Procedure completion:  Tolerated well, no immediate complications     Medications Ordered in ED Medications - No data to display  ED Course/ Medical Decision Making/ A&P                           Medical Decision Making  Staple removal   Pt to ER for staple removal and wound check as above after fall 7 days ago.  He is afebrile, nontoxic-appearing, and in no acute distress with reassuring vital signs.  Procedure performed and tolerated well. Vitals normal, wound is well-healing with no signs of infection. Scar minimization & return precautions given at dc.  Patient is understanding and amenable with plan, discharged in stable condition.   Final Clinical Impression(s) / ED Diagnoses Final diagnoses:  Encounter for staple removal    Rx / DC Orders ED Discharge Orders     None     An After Visit Summary was printed and given to the patient.     Nestor Lewandowsky 01/02/22 1515    Wyvonnia Dusky, MD 01/02/22 [phone]

## 2022-01-02 NOTE — ED Triage Notes (Signed)
Pt presents to ED for suture removal from head, denies problems

## 2022-01-02 NOTE — Discharge Instructions (Signed)
Rinse your wound with soap and water and shower normally. Follow-up with your pcp as needed.   Return if development of any new or worsening symptoms

## 2022-02-03 ENCOUNTER — Other Ambulatory Visit: Payer: Self-pay | Admitting: Family Medicine

## 2022-02-09 ENCOUNTER — Other Ambulatory Visit: Payer: Self-pay | Admitting: Family Medicine

## 2022-02-23 ENCOUNTER — Other Ambulatory Visit: Payer: Self-pay | Admitting: Family Medicine

## 2022-02-23 ENCOUNTER — Telehealth: Payer: Self-pay | Admitting: *Deleted

## 2022-02-23 MED ORDER — MELOXICAM 15 MG PO TABS: 15.0000 mg | ORAL_TABLET | Freq: Every day | ORAL | 0 refills | Status: DC | PRN

## 2022-02-23 NOTE — Telephone Encounter (Signed)
Patient left message on machine that he would like a script for meloxicam

## 2022-02-24 DIAGNOSIS — H401123 Primary open-angle glaucoma, left eye, severe stage: Secondary | ICD-10-CM | POA: Diagnosis not present

## 2022-02-24 DIAGNOSIS — H25811 Combined forms of age-related cataract, right eye: Secondary | ICD-10-CM | POA: Diagnosis not present

## 2022-02-24 NOTE — Telephone Encounter (Signed)
Cook, Jayce G, DO   ? ?Rx sent.   ? ?

## 2022-05-03 ENCOUNTER — Ambulatory Visit: Payer: Medicare HMO | Admitting: Family Medicine

## 2022-05-04 ENCOUNTER — Ambulatory Visit (INDEPENDENT_AMBULATORY_CARE_PROVIDER_SITE_OTHER): Payer: Medicare HMO | Admitting: Family Medicine

## 2022-05-04 ENCOUNTER — Encounter: Payer: Self-pay | Admitting: Family Medicine

## 2022-05-04 VITALS — BP 111/71 | HR 66 | Wt 190.8 lb

## 2022-05-04 DIAGNOSIS — E785 Hyperlipidemia, unspecified: Secondary | ICD-10-CM | POA: Diagnosis not present

## 2022-05-04 DIAGNOSIS — M79651 Pain in right thigh: Secondary | ICD-10-CM | POA: Diagnosis not present

## 2022-05-04 DIAGNOSIS — M47816 Spondylosis without myelopathy or radiculopathy, lumbar region: Secondary | ICD-10-CM | POA: Diagnosis not present

## 2022-05-04 DIAGNOSIS — I10 Essential (primary) hypertension: Secondary | ICD-10-CM | POA: Diagnosis not present

## 2022-05-04 MED ORDER — MELOXICAM 15 MG PO TABS: 15.0000 mg | ORAL_TABLET | Freq: Every day | ORAL | 0 refills | Status: DC | PRN

## 2022-05-04 NOTE — Patient Instructions (Signed)
Referral is in.  They will be in touch.  Continue your medication.  Follow up in 6 months.

## 2022-05-04 NOTE — Assessment & Plan Note (Addendum)
Stable. Continue Amlodipine.

## 2022-05-04 NOTE — Assessment & Plan Note (Signed)
Continue meloxicam.  Refill today.  Advise use as needed.  Referring to neurosurgery.

## 2022-05-04 NOTE — Assessment & Plan Note (Addendum)
Continue Crestor 

## 2022-05-04 NOTE — Progress Notes (Signed)
Subjective:  Patient ID: Anthony Glass, male    DOB: March 04, 1946  Age: 77 y.o. MRN: [id number]  CC: Chief Complaint  Patient presents with   Hypertension    Pt arrives for follow up. Pt states no concerns with HTN.    HPI:  77 year old male with hypertension, chronic pain of the right thigh, and hyperlipidemia presents for follow-up.  Patient reports that he continues to have pain of the right medial thigh.  Prior imaging of the lumbar spine has revealed degenerative changes.  No significant abnormalities of the hip, femur, or knee.  Patient states that he is now having difficulty standing up straight.  He reports some difficulty with ambulation as result.  We will discuss this today.  Hypertension stable on amlodipine.  Patient tolerating Crestor.  Patient Active Problem List   Diagnosis Date Noted   Lumbar spondylosis 05/04/2022   Right thigh pain 03/02/2021   Essential hypertension 04/16/2018   Hyperlipidemia 09/08/2015   Renal stones     Social Hx   Social History   Socioeconomic History   Marital status: Divorced    Spouse name: Not on file   Number of children: Not on file   Years of education: Not on file   Highest education level: Not on file  Occupational History   Not on file  Tobacco Use   Smoking status: Former    Packs/day: 0.00    Years: 20.00    Total pack years: 0.00    Types: Cigarettes    Quit date: 02/09/1982    Years since quitting: 40.2   Smokeless tobacco: Former    Quit date: 12/27/1981  Vaping Use   Vaping Use: Never used  Substance and Sexual Activity   Alcohol use: Yes    Comment: 1-2 drinks daily    Drug use: No   Sexual activity: Yes    Birth control/protection: None  Other Topics Concern   Not on file  Social History Narrative   Not on file   Social Determinants of Health   Financial Resource Strain: Not on file  Food Insecurity: Not on file  Transportation Needs: Not on file  Physical Activity: Not on file  Stress:  Not on file  Social Connections: Not on file    Review of Systems Per HPI  Objective:  BP 111/71   Pulse 66   Wt 190 lb 12.8 oz (86.5 kg)   SpO2 95%   BMI 25.17 kg/m      05/04/2022    3:01 PM 01/02/2022    2:25 PM 01/02/2022    2:23 PM  BP/Weight  Systolic BP [id number]   Diastolic BP 71 88   Wt. (Lbs) 190.8  178  BMI 25.17 kg/m2  23.48 kg/m2    Physical Exam Vitals and nursing note reviewed.  Constitutional:      General: He is not in acute distress.    Appearance: Normal appearance.  HENT:     Head: Normocephalic and atraumatic.  Cardiovascular:     Rate and Rhythm: Normal rate and regular rhythm.  Pulmonary:     Effort: Pulmonary effort is normal.     Breath sounds: Normal breath sounds. No wheezing or rales.  Musculoskeletal:     Comments: Examination of the spine reveals scoliosis.  Decreased range of motion.  Neurological:     Mental Status: He is alert.  Psychiatric:     Comments: Flat affect.     Lab Results  Component Value Date  WBC 7.0 12/26/2021   HGB 14.7 12/26/2021   HCT 43.4 12/26/2021   PLT 227 12/26/2021   GLUCOSE 97 12/26/2021   CHOL 209 (H) 10/31/2021   TRIG 135 10/31/2021   HDL 50 10/31/2021   LDLCALC 135 (H) 10/31/2021   ALT 14 10/31/2021   AST 22 10/31/2021   NA 140 12/26/2021   K 4.7 12/26/2021   CL 103 12/26/2021   CREATININE 0.88 12/26/2021   BUN 24 (H) 12/26/2021   CO2 28 12/26/2021   TSH 0.998 04/01/2019   INR 1.0 12/26/2021   HGBA1C  03/17/2009    5.9 (NOTE) The ADA recommends the following therapeutic goal for glycemic control related to Hgb A1c measurement: Goal of therapy: <6.5 Hgb A1c  Reference: American Diabetes Association: Clinical Practice Recommendations 2010, Diabetes Care, 2010, 33: (Suppl  1).     Assessment & Plan:   Problem List Items Addressed This Visit       Cardiovascular and Mediastinum   Essential hypertension    Stable. Continue Amlodipine.        Musculoskeletal and Integument    Lumbar spondylosis - Primary    Continue meloxicam.  Refill today.  Advise use as needed.  Referring to neurosurgery.      Relevant Medications   meloxicam (MOBIC) 15 MG tablet   Other Relevant Orders   Ambulatory referral to Neurosurgery     Other   Right thigh pain   Relevant Orders   Ambulatory referral to Neurosurgery   Hyperlipidemia    Continue Crestor.       Meds ordered this encounter  Medications   meloxicam (MOBIC) 15 MG tablet    Sig: Take 1 tablet (15 mg total) by mouth daily as needed for pain.    Dispense:  30 tablet    Refill:  0    Follow-up:  Return in about 6 months (around 11/02/2022).  Vesta

## 2022-05-07 ENCOUNTER — Other Ambulatory Visit: Payer: Self-pay | Admitting: Family Medicine

## 2022-05-22 DIAGNOSIS — M79605 Pain in left leg: Secondary | ICD-10-CM | POA: Diagnosis not present

## 2022-05-22 DIAGNOSIS — R29818 Other symptoms and signs involving the nervous system: Secondary | ICD-10-CM | POA: Diagnosis not present

## 2022-05-22 DIAGNOSIS — M79604 Pain in right leg: Secondary | ICD-10-CM | POA: Diagnosis not present

## 2022-05-24 ENCOUNTER — Other Ambulatory Visit (HOSPITAL_COMMUNITY): Payer: Self-pay | Admitting: Neurosurgery

## 2022-05-24 DIAGNOSIS — M79605 Pain in left leg: Secondary | ICD-10-CM

## 2022-06-17 ENCOUNTER — Other Ambulatory Visit: Payer: Self-pay | Admitting: Family Medicine

## 2022-06-23 ENCOUNTER — Other Ambulatory Visit: Payer: Self-pay | Admitting: *Deleted

## 2022-06-23 ENCOUNTER — Ambulatory Visit (HOSPITAL_COMMUNITY): Payer: Medicare HMO

## 2022-06-23 MED ORDER — ROSUVASTATIN CALCIUM 10 MG PO TABS: 10.0000 mg | ORAL_TABLET | Freq: Every day | ORAL | 0 refills | Status: DC

## 2022-06-27 DIAGNOSIS — H401123 Primary open-angle glaucoma, left eye, severe stage: Secondary | ICD-10-CM | POA: Diagnosis not present

## 2022-06-27 DIAGNOSIS — H25811 Combined forms of age-related cataract, right eye: Secondary | ICD-10-CM | POA: Diagnosis not present

## 2022-07-11 ENCOUNTER — Telehealth: Payer: Self-pay | Admitting: Family Medicine

## 2022-07-11 NOTE — Telephone Encounter (Signed)
Contacted W[facility] Penning to schedule their annual wellness visit. Appointment made for 07/21/2022.   Thank you,  Judeth Cornfield,  AMB Clinical Support [facility] AWV Program Direct Dial ??[phone]

## 2022-07-11 NOTE — Telephone Encounter (Signed)
Called patient to schedule Medicare Annual Wellness Visit (AWV). Left message for patient to call back and schedule Medicare Annual Wellness Visit (AWV).  Last date of AWV: 04/16/2019   Please schedule an appointment at any time with Toni Amend, [facility] .  If any questions, please contact me at [phone].  Thank you,  Judeth Cornfield,  AMB Clinical Support [facility] AWV Program Direct Dial ??[phone]

## 2022-07-13 ENCOUNTER — Ambulatory Visit (HOSPITAL_COMMUNITY)
Admission: RE | Admit: 2022-07-13 | Discharge: 2022-07-13 | Disposition: A | Discharge status: Home / Self Care | Payer: Medicare HMO | Source: Ambulatory Visit | Attending: Neurosurgery | Admitting: Neurosurgery

## 2022-07-13 DIAGNOSIS — M545 Low back pain, unspecified: Secondary | ICD-10-CM | POA: Diagnosis not present

## 2022-07-13 DIAGNOSIS — M79605 Pain in left leg: Secondary | ICD-10-CM | POA: Insufficient documentation

## 2022-07-13 DIAGNOSIS — M79604 Pain in right leg: Secondary | ICD-10-CM | POA: Insufficient documentation

## 2022-07-13 DIAGNOSIS — M4316 Spondylolisthesis, lumbar region: Secondary | ICD-10-CM | POA: Diagnosis not present

## 2022-09-04 DIAGNOSIS — M79604 Pain in right leg: Secondary | ICD-10-CM | POA: Diagnosis not present

## 2022-09-04 DIAGNOSIS — M79605 Pain in left leg: Secondary | ICD-10-CM | POA: Diagnosis not present

## 2022-09-05 ENCOUNTER — Other Ambulatory Visit: Payer: Self-pay | Admitting: Family Medicine

## 2022-10-31 DIAGNOSIS — H25812 Combined forms of age-related cataract, left eye: Secondary | ICD-10-CM | POA: Diagnosis not present

## 2022-10-31 DIAGNOSIS — H401123 Primary open-angle glaucoma, left eye, severe stage: Secondary | ICD-10-CM | POA: Diagnosis not present

## 2022-11-02 ENCOUNTER — Ambulatory Visit (INDEPENDENT_AMBULATORY_CARE_PROVIDER_SITE_OTHER): Payer: Medicare HMO | Admitting: Family Medicine

## 2022-11-02 VITALS — BP 117/79 | HR 75 | Wt 180.4 lb

## 2022-11-02 DIAGNOSIS — Z130 Encounter for screening for diseases of the blood and blood-forming organs and certain disorders involving the immune mechanism: Secondary | ICD-10-CM

## 2022-11-02 DIAGNOSIS — E785 Hyperlipidemia, unspecified: Secondary | ICD-10-CM | POA: Diagnosis not present

## 2022-11-02 DIAGNOSIS — I10 Essential (primary) hypertension: Secondary | ICD-10-CM | POA: Diagnosis not present

## 2022-11-02 MED ORDER — MELOXICAM 15 MG PO TABS: 15.0000 mg | ORAL_TABLET | Freq: Every day | ORAL | 0 refills | Status: DC | PRN

## 2022-11-02 MED ORDER — AMLODIPINE BESYLATE 10 MG PO TABS: ORAL_TABLET | ORAL | 1 refills | Status: DC

## 2022-11-02 NOTE — Patient Instructions (Signed)
Consider flu and pneumonia vaccines later this fall.  Medications as prescribed.  Follow up in 6 months.

## 2022-11-03 NOTE — Assessment & Plan Note (Signed)
Hypertension stable.  Continue amlodipine.

## 2022-11-03 NOTE — Progress Notes (Signed)
Subjective:  Patient ID: Anthony Glass, male    DOB: Mar 11, 1946  Age: 77 y.o. MRN: [id number]  CC:  Follow up  HPI:  77 year old male presents for follow-up.  Blood pressure well-controlled on amlodipine.  Patient does not have any issues with back pain or thigh pain at this time.  Needs labs to assess hyperlipidemia.  He is compliant with Crestor.  Denies chest pain or shortness of breath.  No other complaints or concerns at this time.  Patient Active Problem List   Diagnosis Date Noted   Lumbar spondylosis 05/04/2022   Right thigh pain 03/02/2021   Essential hypertension 04/16/2018   Hyperlipidemia 09/08/2015   Renal stones     Social Hx   Social History   Socioeconomic History   Marital status: Divorced    Spouse name: Not on file   Number of children: Not on file   Years of education: Not on file   Highest education level: Not on file  Occupational History   Not on file  Tobacco Use   Smoking status: Former    Current packs/day: 0.00    Types: Cigarettes    Quit date: 02/09/1962    Years since quitting: 60.7   Smokeless tobacco: Former    Quit date: 12/27/1981  Vaping Use   Vaping status: Never Used  Substance and Sexual Activity   Alcohol use: Yes    Comment: 1-2 drinks daily    Drug use: No   Sexual activity: Yes    Birth control/protection: None  Other Topics Concern   Not on file  Social History Narrative   Not on file   Social Determinants of Health   Financial Resource Strain: Not on file  Food Insecurity: Not on file  Transportation Needs: Not on file  Physical Activity: Not on file  Stress: Not on file  Social Connections: Not on file    Review of Systems Per HPI  Objective:  BP 117/79   Pulse 75   Wt 180 lb 6.4 oz (81.8 kg)   SpO2 97%   BMI 23.80 kg/m      11/02/2022    2:50 PM 11/02/2022    2:21 PM 05/04/2022    3:01 PM  BP/Weight  Systolic BP 117 135 111  Diastolic BP 79 92 71  Wt. (Lbs)  180.4 190.8  BMI  23.8  kg/m2 25.17 kg/m2    Physical Exam Vitals and nursing note reviewed.  Constitutional:      General: He is not in acute distress.    Appearance: Normal appearance.  HENT:     Head: Normocephalic and atraumatic.  Eyes:     General:        Right eye: No discharge.        Left eye: No discharge.     Conjunctiva/sclera: Conjunctivae normal.  Cardiovascular:     Rate and Rhythm: Normal rate and regular rhythm.  Pulmonary:     Effort: Pulmonary effort is normal.     Breath sounds: Normal breath sounds. No wheezing, rhonchi or rales.  Neurological:     Mental Status: He is alert.     Lab Results  Component Value Date   WBC 7.0 12/26/2021   HGB 14.7 12/26/2021   HCT 43.4 12/26/2021   PLT 227 12/26/2021   GLUCOSE 97 12/26/2021   CHOL 209 (H) 10/31/2021   TRIG 135 10/31/2021   HDL 50 10/31/2021   LDLCALC 135 (H) 10/31/2021   ALT 14 10/31/2021  AST 22 10/31/2021   NA 140 12/26/2021   K 4.7 12/26/2021   CL 103 12/26/2021   CREATININE 0.88 12/26/2021   BUN 24 (H) 12/26/2021   CO2 28 12/26/2021   TSH 0.998 04/01/2019   INR 1.0 12/26/2021   HGBA1C  03/17/2009    5.9 (NOTE) The ADA recommends the following therapeutic goal for glycemic control related to Hgb A1c measurement: Goal of therapy: <6.5 Hgb A1c  Reference: American Diabetes Association: Clinical Practice Recommendations 2010, Diabetes Care, 2010, 33: (Suppl  1).     Assessment & Plan:   Problem List Items Addressed This Visit       Cardiovascular and Mediastinum   Essential hypertension - Primary    Hypertension stable.  Continue amlodipine.      Relevant Medications   amLODipine (NORVASC) 10 MG tablet   Other Relevant Orders   CMP14+EGFR     Other   Hyperlipidemia    Patient is tolerating statin.  Reassessing control with lipid panel today.  Continue current medication.      Relevant Medications   amLODipine (NORVASC) 10 MG tablet   Other Relevant Orders   Lipid panel   Other Visit Diagnoses      Screening for deficiency anemia       Relevant Orders   CBC       Meds ordered this encounter  Medications   amLODipine (NORVASC) 10 MG tablet    Sig: TAKE 1 TABLET BY MOUTH ONCE DAILY AT BEDTIME    Dispense:  90 tablet    Refill:  1   meloxicam (MOBIC) 15 MG tablet    Sig: Take 1 tablet (15 mg total) by mouth daily as needed for pain.    Dispense:  30 tablet    Refill:  0    Follow-up:   6 months  Cadyn Fann Adriana Simas DO [facility]

## 2022-11-03 NOTE — Assessment & Plan Note (Signed)
Patient is tolerating statin.  Reassessing control with lipid panel today.  Continue current medication.

## 2022-11-27 DIAGNOSIS — E785 Hyperlipidemia, unspecified: Secondary | ICD-10-CM | POA: Diagnosis not present

## 2022-11-27 DIAGNOSIS — Z130 Encounter for screening for diseases of the blood and blood-forming organs and certain disorders involving the immune mechanism: Secondary | ICD-10-CM | POA: Diagnosis not present

## 2022-11-27 DIAGNOSIS — I10 Essential (primary) hypertension: Secondary | ICD-10-CM | POA: Diagnosis not present

## 2022-11-28 LAB — CMP14+EGFR
ALT: 27 IU/L (ref 0–44)
AST: 23 IU/L (ref 0–40)
Albumin: 4.3 g/dL (ref 3.8–4.8)
Alkaline Phosphatase: 63 IU/L (ref 44–121)
BUN/Creatinine Ratio: 15 (ref 10–24)
BUN: 15 mg/dL (ref 8–27)
Bilirubin Total: 0.5 mg/dL (ref 0.0–1.2)
CO2: 23 mmol/L (ref 20–29)
Calcium: 9.4 mg/dL (ref 8.6–10.2)
Chloride: 106 mmol/L (ref 96–106)
Creatinine, Ser: 0.99 mg/dL (ref 0.76–1.27)
Globulin, Total: 2.1 g/dL (ref 1.5–4.5)
Glucose: 93 mg/dL (ref 70–99)
Potassium: 4.6 mmol/L (ref 3.5–5.2)
Sodium: 141 mmol/L (ref 134–144)
Total Protein: 6.4 g/dL (ref 6.0–8.5)
eGFR: 78 mL/min/{1.73_m2} (ref >59)

## 2022-11-28 LAB — LIPID PANEL
Chol/HDL Ratio: 2.3 ratio (ref 0.0–5.0)
Cholesterol, Total: 128 mg/dL (ref 100–199)
HDL: 56 mg/dL (ref >39)
LDL Chol Calc (NIH): 57 mg/dL (ref 0–99)
Triglycerides: 74 mg/dL (ref 0–149)
VLDL Cholesterol Cal: 15 mg/dL (ref 5–40)

## 2022-11-28 LAB — CBC
Hematocrit: 41.6 % (ref 37.5–51.0)
Hemoglobin: 14 g/dL (ref 13.0–17.7)
MCH: 32 pg (ref 26.6–33.0)
MCHC: 33.7 g/dL (ref 31.5–35.7)
MCV: 95 fL (ref 79–97)
Platelets: 207 10*3/uL (ref 150–450)
RBC: 4.38 x10E6/uL (ref 4.14–5.80)
RDW: 12.8 % (ref 11.6–15.4)
WBC: 4.3 10*3/uL (ref 3.4–10.8)

## 2022-12-04 DIAGNOSIS — H25812 Combined forms of age-related cataract, left eye: Secondary | ICD-10-CM | POA: Diagnosis not present

## 2022-12-04 DIAGNOSIS — H401123 Primary open-angle glaucoma, left eye, severe stage: Secondary | ICD-10-CM | POA: Diagnosis not present

## 2022-12-13 ENCOUNTER — Other Ambulatory Visit: Payer: Self-pay | Admitting: Family Medicine

## 2023-01-11 ENCOUNTER — Other Ambulatory Visit: Payer: Self-pay | Admitting: Family Medicine

## 2023-02-13 ENCOUNTER — Other Ambulatory Visit: Payer: Self-pay | Admitting: Nurse Practitioner

## 2023-03-11 ENCOUNTER — Other Ambulatory Visit: Payer: Self-pay | Admitting: Family Medicine

## 2023-03-19 NOTE — Telephone Encounter (Signed)
--   called patient and left a message called pharmacy and confirmed the medication, meloxicam will be ready for pick up today---  Copied from CRM [id number]. Topic: Clinical - Prescription Issue >> Mar 19, 2023 10:59 AM Carlatta H wrote: Reason for CRM: meloxicam (MOBIC) 15 MG tablet [[id number]]  DISCONTINUED//Patient did not receive medication//States that its discontinued

## 2023-04-15 ENCOUNTER — Other Ambulatory Visit: Payer: Self-pay | Admitting: Family Medicine

## 2023-04-16 ENCOUNTER — Other Ambulatory Visit: Payer: Self-pay

## 2023-05-01 ENCOUNTER — Ambulatory Visit: Payer: Medicare HMO | Admitting: Family Medicine

## 2023-05-01 DIAGNOSIS — J209 Acute bronchitis, unspecified: Secondary | ICD-10-CM | POA: Insufficient documentation

## 2023-05-01 DIAGNOSIS — H1033 Unspecified acute conjunctivitis, bilateral: Secondary | ICD-10-CM

## 2023-05-01 DIAGNOSIS — H109 Unspecified conjunctivitis: Secondary | ICD-10-CM | POA: Insufficient documentation

## 2023-05-01 MED ORDER — AMOXICILLIN-POT CLAVULANATE 875-125 MG PO TABS: 1.0000 | ORAL_TABLET | Freq: Two times a day (BID) | ORAL | 0 refills | Status: DC

## 2023-05-01 MED ORDER — MOXIFLOXACIN HCL 0.5 % OP SOLN: 1.0000 [drp] | Freq: Three times a day (TID) | OPHTHALMIC | 0 refills | Status: AC

## 2023-05-01 NOTE — Progress Notes (Signed)
Subjective:  Patient ID: Anthony Glass, male    DOB: 12-Apr-1945  Age: 78 y.o. MRN: [id number]  CC:   Chief Complaint  Patient presents with   cough and congestion     No fever , going on for more than a week , coughing up green mucus    HPI:  78 year old male presents with respiratory symptoms.  Patient reports that he has been sick for at least a week.  He reports cough and congestion.  Cough is productive of discolored sputum.  Patient has also had significant eye discharge.  Currently has purulent eye discharge.  He has had chills as well.  Has taken over-the-counter cough medication without resolution.  No reported sick contacts.  No fever.  Patient Active Problem List   Diagnosis Date Noted   Conjunctivitis 05/01/2023   Acute bronchitis 05/01/2023   Lumbar spondylosis 05/04/2022   Right thigh pain 03/02/2021   Essential hypertension 04/16/2018   Hyperlipidemia 09/08/2015   Renal stones     Social Hx   Social History   Socioeconomic History   Marital status: Divorced    Spouse name: Not on file   Number of children: Not on file   Years of education: Not on file   Highest education level: Not on file  Occupational History   Not on file  Tobacco Use   Smoking status: Former    Current packs/day: 0.00    Types: Cigarettes    Quit date: 02/09/1962    Years since quitting: 61.2   Smokeless tobacco: Former    Quit date: 12/27/1981  Vaping Use   Vaping status: Never Used  Substance and Sexual Activity   Alcohol use: Yes    Comment: 1-2 drinks daily    Drug use: No   Sexual activity: Yes    Birth control/protection: None  Other Topics Concern   Not on file  Social History Narrative   Not on file   Social Drivers of Health   Financial Resource Strain: Not on file  Food Insecurity: Not on file  Transportation Needs: Not on file  Physical Activity: Not on file  Stress: Not on file  Social Connections: Not on file    Review of Systems Per  HPI  Objective:  BP (!) 138/90   Pulse 68   Temp 98.7 F (37.1 C)   Ht 6\' 1"  (1.854 m)   Wt 187 lb (84.8 kg)   BMI 24.67 kg/m      05/01/2023    9:07 AM 11/02/2022    2:50 PM 11/02/2022    2:21 PM  BP/Weight  Systolic BP 138 117 135  Diastolic BP 90 79 92  Wt. (Lbs) 187  180.4  BMI 24.67 kg/m2  23.8 kg/m2    Physical Exam Vitals and nursing note reviewed.  Constitutional:      General: He is not in acute distress. HENT:     Head: Normocephalic and atraumatic.  Eyes:     Comments: Bilateral conjunctival injection.  Bilateral purulent discharge.  Cardiovascular:     Rate and Rhythm: Normal rate and regular rhythm.  Pulmonary:     Effort: Pulmonary effort is normal.     Comments: Faint wheezing. Neurological:     Mental Status: He is alert.     Lab Results  Component Value Date   WBC 4.3 11/27/2022   HGB 14.0 11/27/2022   HCT 41.6 11/27/2022   PLT 207 11/27/2022   GLUCOSE 93 11/27/2022   CHOL 128 11/27/2022  TRIG 74 11/27/2022   HDL 56 11/27/2022   LDLCALC 57 11/27/2022   ALT 27 11/27/2022   AST 23 11/27/2022   NA 141 11/27/2022   K 4.6 11/27/2022   CL 106 11/27/2022   CREATININE 0.99 11/27/2022   BUN 15 11/27/2022   CO2 23 11/27/2022   TSH 0.998 04/01/2019   INR 1.0 12/26/2021   HGBA1C  03/17/2009    5.9 (NOTE) The ADA recommends the following therapeutic goal for glycemic control related to Hgb A1c measurement: Goal of therapy: <6.5 Hgb A1c  Reference: American Diabetes Association: Clinical Practice Recommendations 2010, Diabetes Care, 2010, 33: (Suppl  1).     Assessment & Plan:   Problem List Items Addressed This Visit       Respiratory   Acute bronchitis   Augmentin as directed.        Other   Conjunctivitis   Vigamox as directed.       Meds ordered this encounter  Medications   moxifloxacin (VIGAMOX) 0.5 % ophthalmic solution    Sig: Place 1 drop into both eyes 3 (three) times daily for 7 days.    Dispense:  3 mL     Refill:  0   amoxicillin-clavulanate (AUGMENTIN) 875-125 MG tablet    Sig: Take 1 tablet by mouth 2 (two) times daily.    Dispense:  20 tablet    Refill:  0    Follow-up:  Return if symptoms worsen or fail to improve.  Everlene Other DO [facility]

## 2023-05-01 NOTE — Assessment & Plan Note (Signed)
Augmentin as directed.

## 2023-05-01 NOTE — Assessment & Plan Note (Signed)
Vigamox as directed.

## 2023-05-04 ENCOUNTER — Other Ambulatory Visit: Payer: Self-pay | Admitting: Family Medicine

## 2023-05-07 ENCOUNTER — Ambulatory Visit: Payer: Medicare HMO | Admitting: Family Medicine

## 2023-05-07 ENCOUNTER — Encounter: Payer: Self-pay | Admitting: Family Medicine

## 2023-05-07 VITALS — BP 122/72 | HR 73 | Temp 98.2°F | Ht 73.0 in | Wt 190.0 lb

## 2023-05-07 DIAGNOSIS — E785 Hyperlipidemia, unspecified: Secondary | ICD-10-CM

## 2023-05-07 DIAGNOSIS — I10 Essential (primary) hypertension: Secondary | ICD-10-CM | POA: Diagnosis not present

## 2023-05-07 DIAGNOSIS — M47816 Spondylosis without myelopathy or radiculopathy, lumbar region: Secondary | ICD-10-CM | POA: Diagnosis not present

## 2023-05-07 MED ORDER — DICLOFENAC SODIUM 75 MG PO TBEC
75.0000 mg | DELAYED_RELEASE_TABLET | Freq: Two times a day (BID) | ORAL | 1 refills | Status: DC | PRN
Start: 2023-05-07 — End: 2023-10-09

## 2023-05-07 NOTE — Patient Instructions (Signed)
Medication as directed for your back pain.  Follow up in 6 months.  Take care  Dr. Adriana Simas

## 2023-05-08 MED ORDER — AMLODIPINE BESYLATE 10 MG PO TABS
10.0000 mg | ORAL_TABLET | Freq: Every day | ORAL | 3 refills | Status: AC
Start: 2023-05-08 — End: ?

## 2023-05-08 MED ORDER — ROSUVASTATIN CALCIUM 10 MG PO TABS
10.0000 mg | ORAL_TABLET | Freq: Every day | ORAL | 3 refills | Status: AC
Start: 2023-05-08 — End: ?

## 2023-05-08 NOTE — Assessment & Plan Note (Signed)
At goal on Crestor.  Continue. 

## 2023-05-08 NOTE — Progress Notes (Signed)
Subjective:  Patient ID: Anthony Glass, male    DOB: 04/29/1945  Age: 78 y.o. MRN: [id number]  CC:   Chief Complaint  Patient presents with   Follow-up    HPI:  78 year old male presents for routine follow-up.  Recently treated for respiratory infection with Augmentin.  He states that he still having congestion.  Recommended continued supportive care.  Patient reports that he continues to have right-sided low back pain.  Was seen by neurosurgeon but was not happy with the visit.  Meloxicam has not helped.  Tylenol helps but is short-lived.  Hypertension stable.  Well-controlled.  LDL at goal on Crestor.  Patient Active Problem List   Diagnosis Date Noted   Lumbar spondylosis 05/04/2022   Right thigh pain 03/02/2021   Essential hypertension 04/16/2018   Hyperlipidemia 09/08/2015   Renal stones     Social Hx   Social History   Socioeconomic History   Marital status: Divorced    Spouse name: Not on file   Number of children: Not on file   Years of education: Not on file   Highest education level: Not on file  Occupational History   Not on file  Tobacco Use   Smoking status: Former    Current packs/day: 0.00    Types: Cigarettes    Quit date: 02/09/1962    Years since quitting: 61.2   Smokeless tobacco: Former    Quit date: 12/27/1981  Vaping Use   Vaping status: Never Used  Substance and Sexual Activity   Alcohol use: Yes    Comment: 1-2 drinks daily    Drug use: No   Sexual activity: Yes    Birth control/protection: None  Other Topics Concern   Not on file  Social History Narrative   Not on file   Social Drivers of Health   Financial Resource Strain: Not on file  Food Insecurity: Not on file  Transportation Needs: Not on file  Physical Activity: Not on file  Stress: Not on file  Social Connections: Not on file    Review of Systems Per HPI  Objective:  BP 122/72   Pulse 73   Temp 98.2 F (36.8 C)   Ht 6\' 1"  (1.854 m)   Wt 190 lb (86.2 kg)    SpO2 94%   BMI 25.07 kg/m      05/07/2023    2:02 PM 05/01/2023    9:07 AM 11/02/2022    2:50 PM  BP/Weight  Systolic BP 122 138 117  Diastolic BP 72 90 79  Wt. (Lbs) 190 187   BMI 25.07 kg/m2 24.67 kg/m2     Physical Exam Constitutional:      General: He is not in acute distress. HENT:     Head: Normocephalic and atraumatic.  Cardiovascular:     Rate and Rhythm: Normal rate and regular rhythm.  Pulmonary:     Effort: Pulmonary effort is normal.     Breath sounds: Normal breath sounds.  Neurological:     Mental Status: He is alert.  Psychiatric:     Comments: Flat affect.     Lab Results  Component Value Date   WBC 4.3 11/27/2022   HGB 14.0 11/27/2022   HCT 41.6 11/27/2022   PLT 207 11/27/2022   GLUCOSE 93 11/27/2022   CHOL 128 11/27/2022   TRIG 74 11/27/2022   HDL 56 11/27/2022   LDLCALC 57 11/27/2022   ALT 27 11/27/2022   AST 23 11/27/2022   NA 141 11/27/2022  K 4.6 11/27/2022   CL 106 11/27/2022   CREATININE 0.99 11/27/2022   BUN 15 11/27/2022   CO2 23 11/27/2022   TSH 0.998 04/01/2019   INR 1.0 12/26/2021   HGBA1C  03/17/2009    5.9 (NOTE) The ADA recommends the following therapeutic goal for glycemic control related to Hgb A1c measurement: Goal of therapy: <6.5 Hgb A1c  Reference: American Diabetes Association: Clinical Practice Recommendations 2010, Diabetes Care, 2010, 33: (Suppl  1).     Assessment & Plan:  Lumbar spondylosis Assessment & Plan: Diclofenac as directed.  Orders: -     Diclofenac Sodium; Take 1 tablet (75 mg total) by mouth 2 (two) times daily as needed for moderate pain (pain score 4-6).  Dispense: 60 tablet; Refill: 1  Essential hypertension Assessment & Plan: Stable on amlodipine.  Continue.  Orders: -     amLODIPine Besylate; Take 1 tablet (10 mg total) by mouth at bedtime.  Dispense: 90 tablet; Refill: 3  Hyperlipidemia, unspecified hyperlipidemia type Assessment & Plan: At goal on Crestor.   Continue.  Orders: -     Rosuvastatin Calcium; Take 1 tablet (10 mg total) by mouth daily.  Dispense: 90 tablet; Refill: 3   Follow-up:  6 months  Annika Selke Adriana Simas DO [facility]

## 2023-05-08 NOTE — Assessment & Plan Note (Signed)
Stable on amlodipine.  Continue.

## 2023-05-08 NOTE — Assessment & Plan Note (Signed)
 Diclofenac as directed.

## 2023-05-28 ENCOUNTER — Telehealth: Payer: Self-pay | Admitting: *Deleted

## 2023-05-28 NOTE — Telephone Encounter (Signed)
 Copied from CRM [id number]. Topic: Clinical - Medication Question >> May 28, 2023  9:47 AM Geroge Baseman wrote: Reason for CRM: Patient still feeling like he is sick after finishing his prescription. Wants to know if dr Adriana Simas suggest more medicine for him please call to advice

## 2023-05-30 DIAGNOSIS — H401123 Primary open-angle glaucoma, left eye, severe stage: Secondary | ICD-10-CM | POA: Diagnosis not present

## 2023-05-31 NOTE — Telephone Encounter (Signed)
 Left message to return call

## 2023-05-31 NOTE — Telephone Encounter (Signed)
 Tommie Sams, DO     Recommend a visit if he is still feeling unwell.

## 2023-06-18 ENCOUNTER — Ambulatory Visit (INDEPENDENT_AMBULATORY_CARE_PROVIDER_SITE_OTHER): Admitting: Family Medicine

## 2023-06-18 ENCOUNTER — Encounter: Payer: Self-pay | Admitting: Family Medicine

## 2023-06-18 VITALS — BP 117/79 | HR 62 | Temp 98.2°F | Ht 73.0 in | Wt 186.0 lb

## 2023-06-18 DIAGNOSIS — R0982 Postnasal drip: Secondary | ICD-10-CM | POA: Insufficient documentation

## 2023-06-18 DIAGNOSIS — M47816 Spondylosis without myelopathy or radiculopathy, lumbar region: Secondary | ICD-10-CM

## 2023-06-18 MED ORDER — TRAMADOL HCL 50 MG PO TABS
50.0000 mg | ORAL_TABLET | Freq: Three times a day (TID) | ORAL | 0 refills | Status: DC | PRN
Start: 2023-06-18 — End: 2023-06-25

## 2023-06-18 MED ORDER — LEVOCETIRIZINE DIHYDROCHLORIDE 5 MG PO TABS
5.0000 mg | ORAL_TABLET | Freq: Every evening | ORAL | 1 refills | Status: AC
Start: 2023-06-18 — End: ?

## 2023-06-18 MED ORDER — IPRATROPIUM BROMIDE 0.06 % NA SOLN
2.0000 | Freq: Four times a day (QID) | NASAL | 0 refills | Status: AC | PRN
Start: 2023-06-18 — End: ?

## 2023-06-18 NOTE — Assessment & Plan Note (Signed)
 Continue diclofenac. PRN Tramadol as well.

## 2023-06-18 NOTE — Patient Instructions (Signed)
 Medications as directed.  Take care  Dr. Adriana Simas

## 2023-06-18 NOTE — Progress Notes (Signed)
 Subjective:  Patient ID: Anthony Glass, male    DOB: 1945-05-16  Age: 78 y.o. MRN: [id number]  CC:   Chief Complaint  Patient presents with   Follow-up    Follow up URI     HPI:  78 year old male presents for follow up regarding respiratory symptoms.   Still having post nasal drip and rhinorrhea. Occurs predominantly in the morning.   Patient also reports that he has had improvement in his back pain with diclofenac but states that there is room for additional improvement (ie it is not optimally controlled).   Patient Active Problem List   Diagnosis Date Noted   Post-nasal drip 06/18/2023   Lumbar spondylosis 05/04/2022   Essential hypertension 04/16/2018   Hyperlipidemia 09/08/2015   Renal stones     Social Hx   Social History   Socioeconomic History   Marital status: Divorced    Spouse name: Not on file   Number of children: Not on file   Years of education: Not on file   Highest education level: Not on file  Occupational History   Not on file  Tobacco Use   Smoking status: Former    Current packs/day: 0.00    Types: Cigarettes    Quit date: 02/09/1962    Years since quitting: 61.3   Smokeless tobacco: Former    Quit date: 12/27/1981  Vaping Use   Vaping status: Never Used  Substance and Sexual Activity   Alcohol use: Yes    Comment: 1-2 drinks daily    Drug use: No   Sexual activity: Yes    Birth control/protection: None  Other Topics Concern   Not on file  Social History Narrative   Not on file   Social Drivers of Health   Financial Resource Strain: Not on file  Food Insecurity: Not on file  Transportation Needs: Not on file  Physical Activity: Not on file  Stress: Not on file  Social Connections: Not on file    Review of Systems Per HPI  Objective:  BP 117/79   Pulse 62   Temp 98.2 F (36.8 C)   Ht 6\' 1"  (1.854 m)   Wt 186 lb (84.4 kg)   SpO2 98%   BMI 24.54 kg/m      06/18/2023    2:39 PM 05/07/2023    2:02 PM 05/01/2023     9:07 AM  BP/Weight  Systolic BP 117 122 138  Diastolic BP 79 72 90  Wt. (Lbs) 186 190 187  BMI 24.54 kg/m2 25.07 kg/m2 24.67 kg/m2    Physical Exam Vitals and nursing note reviewed.  Constitutional:      General: He is not in acute distress.    Appearance: Normal appearance.  HENT:     Head: Normocephalic and atraumatic.  Cardiovascular:     Rate and Rhythm: Normal rate and regular rhythm.  Pulmonary:     Effort: Pulmonary effort is normal.     Breath sounds: Normal breath sounds. No wheezing, rhonchi or rales.  Neurological:     Mental Status: He is alert.     Lab Results  Component Value Date   WBC 4.3 11/27/2022   HGB 14.0 11/27/2022   HCT 41.6 11/27/2022   PLT 207 11/27/2022   GLUCOSE 93 11/27/2022   CHOL 128 11/27/2022   TRIG 74 11/27/2022   HDL 56 11/27/2022   LDLCALC 57 11/27/2022   ALT 27 11/27/2022   AST 23 11/27/2022   NA 141 11/27/2022  K 4.6 11/27/2022   CL 106 11/27/2022   CREATININE 0.99 11/27/2022   BUN 15 11/27/2022   CO2 23 11/27/2022   TSH 0.998 04/01/2019   INR 1.0 12/26/2021   HGBA1C  03/17/2009    5.9 (NOTE) The ADA recommends the following therapeutic goal for glycemic control related to Hgb A1c measurement: Goal of therapy: <6.5 Hgb A1c  Reference: American Diabetes Association: Clinical Practice Recommendations 2010, Diabetes Care, 2010, 33: (Suppl  1).     Assessment & Plan:  Lumbar spondylosis Assessment & Plan: Continue diclofenac. PRN Tramadol as well.   Orders: -     traMADol HCl; Take 1 tablet (50 mg total) by mouth every 8 (eight) hours as needed for moderate pain (pain score 4-6) or severe pain (pain score 7-10).  Dispense: 15 tablet; Refill: 0  Post-nasal drip Assessment & Plan: Atrovent and Xyzal as directed.   Orders: -     Ipratropium Bromide; Place 2 sprays into both nostrils 4 (four) times daily as needed for rhinitis.  Dispense: 15 mL; Refill: 0 -     Levocetirizine Dihydrochloride; Take 1 tablet (5 mg total)  by mouth every evening.  Dispense: 30 tablet; Refill: 1   Timberlee Roblero DO [facility]

## 2023-06-18 NOTE — Assessment & Plan Note (Signed)
 Atrovent and Xyzal as directed.

## 2023-06-21 NOTE — Telephone Encounter (Signed)
 Patient was seen by provider 06/18/23

## 2023-06-25 ENCOUNTER — Other Ambulatory Visit: Payer: Self-pay | Admitting: Family Medicine

## 2023-06-25 DIAGNOSIS — M47816 Spondylosis without myelopathy or radiculopathy, lumbar region: Secondary | ICD-10-CM

## 2023-08-02 ENCOUNTER — Other Ambulatory Visit: Payer: Self-pay | Admitting: Family Medicine

## 2023-08-02 DIAGNOSIS — M47816 Spondylosis without myelopathy or radiculopathy, lumbar region: Secondary | ICD-10-CM

## 2023-09-17 ENCOUNTER — Other Ambulatory Visit: Payer: Self-pay | Admitting: Family Medicine

## 2023-09-17 DIAGNOSIS — M47816 Spondylosis without myelopathy or radiculopathy, lumbar region: Secondary | ICD-10-CM

## 2023-10-09 ENCOUNTER — Other Ambulatory Visit: Payer: Self-pay | Admitting: Family Medicine

## 2023-10-09 DIAGNOSIS — M47816 Spondylosis without myelopathy or radiculopathy, lumbar region: Secondary | ICD-10-CM

## 2023-10-31 ENCOUNTER — Other Ambulatory Visit: Payer: Self-pay | Admitting: Family Medicine

## 2023-10-31 DIAGNOSIS — M47816 Spondylosis without myelopathy or radiculopathy, lumbar region: Secondary | ICD-10-CM

## 2023-11-06 ENCOUNTER — Encounter: Payer: Self-pay | Admitting: Family Medicine

## 2023-11-06 ENCOUNTER — Ambulatory Visit (INDEPENDENT_AMBULATORY_CARE_PROVIDER_SITE_OTHER): Payer: Medicare HMO | Admitting: Family Medicine

## 2023-11-06 VITALS — BP 113/76 | HR 71 | Temp 97.7°F | Ht 73.0 in | Wt 178.0 lb

## 2023-11-06 DIAGNOSIS — M47816 Spondylosis without myelopathy or radiculopathy, lumbar region: Secondary | ICD-10-CM

## 2023-11-06 DIAGNOSIS — Z130 Encounter for screening for diseases of the blood and blood-forming organs and certain disorders involving the immune mechanism: Secondary | ICD-10-CM | POA: Diagnosis not present

## 2023-11-06 DIAGNOSIS — E785 Hyperlipidemia, unspecified: Secondary | ICD-10-CM

## 2023-11-06 DIAGNOSIS — I10 Essential (primary) hypertension: Secondary | ICD-10-CM

## 2023-11-06 NOTE — Assessment & Plan Note (Signed)
Has been stable on Crestor.  Continue.  Lipid panel today.

## 2023-11-06 NOTE — Progress Notes (Signed)
 Subjective:  Patient ID: Anthony Glass, male    DOB: Nov 24, 1945  Age: 78 y.o. MRN: [id number]  CC:   Chief Complaint  Patient presents with   6 month follow up    HPI: 78 year old male presents for follow-up.  Patient's blood pressure is well-controlled on amlodipine .  Patient reports that tramadol  helps him significantly with his low back pain.  However, it seems to wear off about hour 7.  He uses Tylenol  as well.  Also reports decreased range of motion of the right hip.  Lipids have been well-controlled on Crestor .  He is tolerating well.  Patient's preventative health care is essentially up-to-date except for flu vaccine which is not yet available and shingles vaccine which he may get at the pharmacy if he desires.  Patient Active Problem List   Diagnosis Date Noted   Post-nasal drip 06/18/2023   Lumbar spondylosis 05/04/2022   Essential hypertension 04/16/2018   Hyperlipidemia 09/08/2015   Renal stones     Social Hx   Social History   Socioeconomic History   Marital status: Divorced    Spouse name: Not on file   Number of children: Not on file   Years of education: Not on file   Highest education level: Not on file  Occupational History   Not on file  Tobacco Use   Smoking status: Former    Current packs/day: 0.00    Types: Cigarettes    Quit date: 02/09/1962    Years since quitting: 61.7   Smokeless tobacco: Former    Quit date: 12/27/1981  Vaping Use   Vaping status: Never Used  Substance and Sexual Activity   Alcohol  use: Yes    Comment: 1-2 drinks daily    Drug use: No   Sexual activity: Yes    Birth control/protection: None  Other Topics Concern   Not on file  Social History Narrative   Not on file   Social Drivers of Health   Financial Resource Strain: Not on file  Food Insecurity: Not on file  Transportation Needs: Not on file  Physical Activity: Not on file  Stress: Not on file  Social Connections: Not on file    Review of  Systems Per HPI  Objective:  BP 113/76   Pulse 71   Temp 97.7 F (36.5 C)   Ht 6' 1 (1.854 m)   Wt 178 lb (80.7 kg)   SpO2 97%   BMI 23.48 kg/m      11/06/2023    1:03 PM 06/18/2023    2:39 PM 05/07/2023    2:02 PM  BP/Weight  Systolic BP 113 117 122  Diastolic BP 76 79 72  Wt. (Lbs) 178 186 190  BMI 23.48 kg/m2 24.54 kg/m2 25.07 kg/m2    Physical Exam Vitals and nursing note reviewed.  Constitutional:      Appearance: Normal appearance.  HENT:     Head: Normocephalic and atraumatic.  Cardiovascular:     Rate and Rhythm: Normal rate and regular rhythm.  Pulmonary:     Effort: Pulmonary effort is normal.     Breath sounds: Normal breath sounds. No wheezing, rhonchi or rales.  Neurological:     Mental Status: He is alert.  Psychiatric:     Comments: Flat affect.     Lab Results  Component Value Date   WBC 4.3 11/27/2022   HGB 14.0 11/27/2022   HCT 41.6 11/27/2022   PLT 207 11/27/2022   GLUCOSE 93 11/27/2022   CHOL 128  11/27/2022   TRIG 74 11/27/2022   HDL 56 11/27/2022   LDLCALC 57 11/27/2022   ALT 27 11/27/2022   AST 23 11/27/2022   NA 141 11/27/2022   K 4.6 11/27/2022   CL 106 11/27/2022   CREATININE 0.99 11/27/2022   BUN 15 11/27/2022   CO2 23 11/27/2022   TSH 0.998 04/01/2019   INR 1.0 12/26/2021   HGBA1C  03/17/2009    5.9 (NOTE) The ADA recommends the following therapeutic goal for glycemic control related to Hgb A1c measurement: Goal of therapy: <6.5 Hgb A1c  Reference: American Diabetes Association: Clinical Practice Recommendations 2010, Diabetes Care, 2010, 33: (Suppl  1).     Assessment & Plan:  Essential hypertension Assessment & Plan: Stable.  Continue amlodipine .  Orders: -     CMP14+EGFR  Hyperlipidemia, unspecified hyperlipidemia type Assessment & Plan: Has been stable on Crestor .  Continue.  Lipid panel today.  Orders: -     Lipid panel  Screening for deficiency anemia -     CBC  Lumbar spondylosis Assessment &  Plan: Fair control with tramadol .  Advised patient that he may use this every 6 hours.  I will change the prescription once he needs refill if he has significant improvement with this.  Continue Tylenol  as needed.  He does not find much improvement with diclofenac .  Discontinuin this medication today.     Follow-up: 6 months  Satish Hammers Bluford DO [facility]

## 2023-11-06 NOTE — Assessment & Plan Note (Signed)
Stable. °-Continue amlodipine °

## 2023-11-06 NOTE — Patient Instructions (Signed)
Continue your medications.  Labs today.  Follow up in 6 months.

## 2023-11-06 NOTE — Assessment & Plan Note (Addendum)
 Fair control with tramadol .  Advised patient that he may use this every 6 hours.  I will change the prescription once he needs refill if he has significant improvement with this.  Continue Tylenol  as needed.  He does not find much improvement with diclofenac .  Discontinuin this medication today.

## 2023-11-27 DIAGNOSIS — H401123 Primary open-angle glaucoma, left eye, severe stage: Secondary | ICD-10-CM | POA: Diagnosis not present

## 2023-12-06 DIAGNOSIS — E785 Hyperlipidemia, unspecified: Secondary | ICD-10-CM | POA: Diagnosis not present

## 2023-12-06 DIAGNOSIS — I10 Essential (primary) hypertension: Secondary | ICD-10-CM | POA: Diagnosis not present

## 2023-12-06 DIAGNOSIS — Z130 Encounter for screening for diseases of the blood and blood-forming organs and certain disorders involving the immune mechanism: Secondary | ICD-10-CM | POA: Diagnosis not present

## 2023-12-07 LAB — CMP14+EGFR
ALT: 16 IU/L (ref 0–44)
AST: 18 IU/L (ref 0–40)
Albumin: 4.4 g/dL (ref 3.8–4.8)
Alkaline Phosphatase: 66 IU/L (ref 47–123)
BUN/Creatinine Ratio: 17 (ref 10–24)
BUN: 18 mg/dL (ref 8–27)
Bilirubin Total: 0.5 mg/dL (ref 0.0–1.2)
CO2: 24 mmol/L (ref 20–29)
Calcium: 9.9 mg/dL (ref 8.6–10.2)
Chloride: 104 mmol/L (ref 96–106)
Creatinine, Ser: 1.03 mg/dL (ref 0.76–1.27)
Globulin, Total: 2 g/dL (ref 1.5–4.5)
Glucose: 96 mg/dL (ref 70–99)
Potassium: 5.1 mmol/L (ref 3.5–5.2)
Sodium: 139 mmol/L (ref 134–144)
Total Protein: 6.4 g/dL (ref 6.0–8.5)
eGFR: 74 mL/min/1.73 (ref >59)

## 2023-12-07 LAB — CBC
Hematocrit: 44.3 % (ref 37.5–51.0)
Hemoglobin: 14.3 g/dL (ref 13.0–17.7)
MCH: 31.9 pg (ref 26.6–33.0)
MCHC: 32.3 g/dL (ref 31.5–35.7)
MCV: 99 fL — ABNORMAL HIGH (ref 79–97)
Platelets: 222 x10E3/uL (ref 150–450)
RBC: 4.48 x10E6/uL (ref 4.14–5.80)
RDW: 13 % (ref 11.6–15.4)
WBC: 4.9 x10E3/uL (ref 3.4–10.8)

## 2023-12-07 LAB — LIPID PANEL
Chol/HDL Ratio: 2.5 ratio (ref 0.0–5.0)
Cholesterol, Total: 133 mg/dL (ref 100–199)
HDL: 54 mg/dL (ref >39)
LDL Chol Calc (NIH): 64 mg/dL (ref 0–99)
Triglycerides: 74 mg/dL (ref 0–149)
VLDL Cholesterol Cal: 15 mg/dL (ref 5–40)

## 2023-12-09 ENCOUNTER — Ambulatory Visit: Payer: Self-pay | Admitting: Family Medicine

## 2023-12-11 NOTE — Telephone Encounter (Signed)
 Left message for patient to call back to let him know about results.  OK for E2C2 to give patient message.

## 2023-12-11 NOTE — Telephone Encounter (Signed)
-----   Message from Jacqulyn KANDICE Ahle sent at 12/09/2023 10:51 PM EDT ----- Labs look good. ----- Message ----- From: Rebecka Memos Lab Results In Sent: 12/07/2023   5:38 AM EDT To: Jayce G Cook, DO

## 2023-12-15 ENCOUNTER — Other Ambulatory Visit: Payer: Self-pay | Admitting: Family Medicine

## 2023-12-15 DIAGNOSIS — M47816 Spondylosis without myelopathy or radiculopathy, lumbar region: Secondary | ICD-10-CM

## 2024-03-11 ENCOUNTER — Other Ambulatory Visit: Payer: Self-pay | Admitting: Family Medicine

## 2024-03-11 DIAGNOSIS — M47816 Spondylosis without myelopathy or radiculopathy, lumbar region: Secondary | ICD-10-CM

## 2024-04-21 ENCOUNTER — Other Ambulatory Visit: Payer: Self-pay | Admitting: Family Medicine

## 2024-04-21 DIAGNOSIS — M47816 Spondylosis without myelopathy or radiculopathy, lumbar region: Secondary | ICD-10-CM

## 2024-05-08 ENCOUNTER — Ambulatory Visit: Admitting: Family Medicine
